# Patient Record
Sex: Female | Born: 1980 | Race: Black or African American | Hispanic: No | Marital: Married | Smoking: Current every day smoker
Health system: Southern US, Community
[De-identification: ages and names within clinical notes are randomized; demographics above are authoritative.]

## PROBLEM LIST (undated history)

## (undated) DIAGNOSIS — Z789 Other specified health status: Secondary | ICD-10-CM

## (undated) DIAGNOSIS — K921 Melena: Secondary | ICD-10-CM

## (undated) DIAGNOSIS — R569 Unspecified convulsions: Secondary | ICD-10-CM

## (undated) DIAGNOSIS — D649 Anemia, unspecified: Secondary | ICD-10-CM

## (undated) DIAGNOSIS — F1090 Alcohol use, unspecified, uncomplicated: Secondary | ICD-10-CM

## (undated) DIAGNOSIS — F329 Major depressive disorder, single episode, unspecified: Secondary | ICD-10-CM

## (undated) DIAGNOSIS — F319 Bipolar disorder, unspecified: Secondary | ICD-10-CM

## (undated) DIAGNOSIS — Z7289 Other problems related to lifestyle: Secondary | ICD-10-CM

## (undated) DIAGNOSIS — I10 Essential (primary) hypertension: Secondary | ICD-10-CM

## (undated) DIAGNOSIS — F32A Depression, unspecified: Secondary | ICD-10-CM

## (undated) HISTORY — PX: NO PAST SURGERIES: SHX2092

## (undated) HISTORY — DX: Melena: K92.1

---

## 2012-02-22 ENCOUNTER — Encounter (HOSPITAL_COMMUNITY): Payer: Self-pay | Admitting: *Deleted

## 2012-02-22 ENCOUNTER — Emergency Department (HOSPITAL_COMMUNITY)
Admission: EM | Admit: 2012-02-22 | Discharge: 2012-02-23 | Disposition: A | Discharge status: Home / Self Care | Payer: Self-pay | Attending: Psychiatry | Admitting: Psychiatry

## 2012-02-22 DIAGNOSIS — F1010 Alcohol abuse, uncomplicated: Secondary | ICD-10-CM | POA: Insufficient documentation

## 2012-02-22 DIAGNOSIS — F1910 Other psychoactive substance abuse, uncomplicated: Secondary | ICD-10-CM | POA: Insufficient documentation

## 2012-02-22 DIAGNOSIS — F319 Bipolar disorder, unspecified: Secondary | ICD-10-CM | POA: Insufficient documentation

## 2012-02-22 DIAGNOSIS — IMO0002 Reserved for concepts with insufficient information to code with codable children: Secondary | ICD-10-CM | POA: Insufficient documentation

## 2012-02-22 HISTORY — DX: Bipolar disorder, unspecified: F31.9

## 2012-02-22 HISTORY — DX: Unspecified convulsions: R56.9

## 2012-02-22 LAB — RAPID URINE DRUG SCREEN, HOSP PERFORMED
Amphetamines: NOT DETECTED
Barbiturates: NOT DETECTED
Benzodiazepines: NOT DETECTED
Tetrahydrocannabinol: NOT DETECTED

## 2012-02-22 LAB — CBC
HCT: 40 % (ref 36.0–46.0)
Hemoglobin: 13.8 g/dL (ref 12.0–15.0)
MCHC: 34.5 g/dL (ref 30.0–36.0)
RDW: 13.2 % (ref 11.5–15.5)
WBC: 8.4 10*3/uL (ref 4.0–10.5)

## 2012-02-22 LAB — COMPREHENSIVE METABOLIC PANEL
ALT: 19 U/L (ref 0–35)
AST: 28 U/L (ref 0–37)
Albumin: 4.2 g/dL (ref 3.5–5.2)
Alkaline Phosphatase: 110 U/L (ref 39–117)
BUN: 4 mg/dL — ABNORMAL LOW (ref 6–23)
Chloride: 95 mEq/L — ABNORMAL LOW (ref 96–112)
Potassium: 3.5 mEq/L (ref 3.5–5.1)
Sodium: 134 mEq/L — ABNORMAL LOW (ref 135–145)
Total Bilirubin: 0.1 mg/dL — ABNORMAL LOW (ref 0.3–1.2)
Total Protein: 8.3 g/dL (ref 6.0–8.3)

## 2012-02-22 LAB — POCT PREGNANCY, URINE: Preg Test, Ur: NEGATIVE

## 2012-02-22 MED ORDER — LORAZEPAM 1 MG PO TABS
1.0000 mg | ORAL_TABLET | Freq: Three times a day (TID) | ORAL | Status: DC | PRN
  Filled 2012-02-22: qty 1

## 2012-02-22 MED ORDER — ONDANSETRON HCL 4 MG PO TABS: 4.0000 mg | ORAL_TABLET | Freq: Three times a day (TID) | ORAL | Status: DC | PRN

## 2012-02-22 MED ORDER — ZIPRASIDONE MESYLATE 20 MG IM SOLR
20.0000 mg | Freq: Once | INTRAMUSCULAR | Status: AC
  Administered 2012-02-22: 20 mg via INTRAMUSCULAR
  Filled 2012-02-22: qty 20

## 2012-02-22 MED ORDER — ACETAMINOPHEN 325 MG PO TABS: 650.0000 mg | ORAL_TABLET | ORAL | Status: DC | PRN

## 2012-02-22 MED ORDER — ZOLPIDEM TARTRATE 5 MG PO TABS: 5.0000 mg | ORAL_TABLET | Freq: Every evening | ORAL | Status: DC | PRN

## 2012-02-22 MED ORDER — ALUM & MAG HYDROXIDE-SIMETH 200-200-20 MG/5ML PO SUSP: 30.0000 mL | ORAL | Status: DC | PRN

## 2012-02-22 MED ORDER — IBUPROFEN 600 MG PO TABS
600.0000 mg | ORAL_TABLET | Freq: Three times a day (TID) | ORAL | Status: DC | PRN
  Administered 2012-02-22: 600 mg via ORAL
  Filled 2012-02-22: qty 1

## 2012-02-22 MED ORDER — NICOTINE 21 MG/24HR TD PT24: 21.0000 mg | MEDICATED_PATCH | Freq: Every day | TRANSDERMAL | Status: DC

## 2012-02-22 NOTE — ED Notes (Signed)
Pt tried to leave the unit b/c another pt pacing the unit told her she could just go out the double doors. Pt said she didn't want to stay here any longer, she was safe and wanted to go home. Security contacted for assist. Pt finally went back to her room after procedures explained to her and some word search puzzles were copied for her as well. Pt came right back out of her room and tried getting out another set of double doors. Security and off duty officer intervened by talking to pt. Pt refused PO Ativan but wanted it to be given to "him, standing down there", however, the hallway was empty. Pt insisted there was someone there and wanted him to take the medicine. Pt was talking to this person sitting on the bed in her room as well. Called MD and obtained new orders. Pt given Geodon without incident. Pt's underwear remain on her body. Consulting civil engineer notified. Will try to get pt to take off after she wakes up.

## 2012-02-22 NOTE — BHH Counselor (Signed)
Pt became a flight risk upon arrival to the unit. IVC and required documentation completed, logged and placed in the pt chart.

## 2012-02-22 NOTE — ED Provider Notes (Signed)
9:16 AM Pt had been trying to get off the psych ward, Geodon injection ordered.  She has a history of bipolar, and had been treated with medicines by doctors in [place], [place].  She had recently moved to [place].  ACT team to see. Osvaldo Human, M.D.   Carleene Cooper III, MD 02/22/12 [phone]

## 2012-02-22 NOTE — BH Assessment (Signed)
Assessment Note   Samantha Howard is a 31 y.o. who presented voluntarily to [facility] via [employer]. However, upon arrival to [facility], she tried to escape the Psych ED and is now under IVC. Pt is poor historian. Pt endorses a euthymic or "alright" mood. Her only depressive symptom is lack of sleep which she attributes to watching too much TV late at night. Her affect is appropriate to circumstances and she is pleasant and cooperative. Pt denies substance abuse. However, she did report drinking more than a 12 pack on 02/21/12 but unsure of amount she drank after that. Writer noted that her drug test was positive for cocaine but pt denied use. 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 Pt notes that over 1 year ago, she experienced a 4-day period in which she didn't sleep at all and had an abundance of energy for projects around her neighborhood. Pt reports she was diagnosed with Bipolar D/O by a psychiatrist in [facility] approx 3 years ago. Pt states she quit taking her psychotropic meds over one year ago b/c she didn't think meds were helping her "feel better". Pt currently denies SI and HI. She also denies any history of suicide attempts. Pt states she called the [employer] this am because she was afraid of hurting herself or her partner. Pt reports she has no recollection of the suicidal statements she stated to [employer] upon their arrival. PT denies AVH and denies delusions. She reports that she was speaking to a pt who had just gone into his room, thus the reason that RN Archie Patten mistakenly thought pt was experiencing A/VH.  Collateral info obtained from [employer] via IVC papers - When [employer] arrived at pt's home, pt told [employer] to "shoot" her and that she "wanted to die".  Collateral info from RN Tonya - Pt was in her room this am speaking to someone who wasn't there in the hallway.  Axis I: 296.7 Bipolar D/O, Most Recent Episode Unspecified Axis II: Deferred Axis III:  Past Medical History  Diagnosis Date  .  Seizures   . Bipolar 1 disorder    Axis IV: occupational problems and problems with primary support group Axis V: 31-40 impairment in reality testing  Past Medical History:  Past Medical History  Diagnosis Date  . Seizures   . Bipolar 1 disorder     History reviewed. No pertinent past surgical history.  Family History: History reviewed. No pertinent family history.  Social History:  does not have a smoking history on file. She does not have any smokeless tobacco history on file. She reports that she drinks alcohol. She reports that she does not use illicit drugs.  Additional Social History:  Alcohol / Drug Use Pain Medications: none Prescriptions: none Over the Counter: none History of alcohol / drug use?: Yes Substance #1 Name of Substance 1: alcohol 1 - Age of First Use: 16 1 - Amount (size/oz): 48 oz 1 - Frequency: 3 times per week 1 - Duration: 14 years  1 - Last Use / Amount: 02/21/12 - 144 oz Substance #2 Name of Substance 2: marijuana 2 - Age of First Use: 16 2 - Amount (size/oz): 1 blunt 2 - Frequency: once a year 2 - Duration: once a year for 14 years 2 - Last Use / Amount: last Aug - Allergies:  Allergies  Allergen Reactions  . Latex Other (See Comments)    Reaction unknown due to it being so long ago    Home Medications:  Medications Prior to Admission  Medication Dose Route Frequency Provider Last Rate Last Dose  . acetaminophen (TYLENOL) tablet 650 mg  650 mg Oral Q4H PRN Ethelda Chick, MD      . alum & mag hydroxide-simeth (MAALOX/MYLANTA) 200-200-20 MG/5ML suspension 30 mL  30 mL Oral PRN Ethelda Chick, MD      . ibuprofen (ADVIL,MOTRIN) tablet 600 mg  600 mg Oral Q8H PRN Ethelda Chick, MD      . LORazepam (ATIVAN) tablet 1 mg  1 mg Oral Q8H PRN Ethelda Chick, MD      . nicotine (NICODERM CQ - dosed in mg/24 hours) patch 21 mg  21 mg Transdermal Daily Ethelda Chick, MD      . ondansetron [facility]) tablet 4 mg  4 mg Oral Q8H PRN Ethelda Chick, MD      . ziprasidone (GEODON) injection 20 mg  20 mg Intramuscular Once Carleene Cooper III, MD   20 mg at 02/22/12 0943  . zolpidem (AMBIEN) tablet 5 mg  5 mg Oral QHS PRN Ethelda Chick, MD       No current outpatient prescriptions on file as of 02/22/2012.    OB/GYN Status:  No LMP recorded.  General Assessment Data Location of Assessment: [facility] ED Living Arrangements: Spouse/significant other Can pt return to current living arrangement?: Yes Admission Status: Involuntary Is patient capable of signing voluntary admission?: Yes Transfer from: Acute Hospital Referral Source: Self/Family/Friend  Education Status Is patient currently in school?: No  Risk to self Suicidal Ideation: No Suicidal Intent: No Is patient at risk for suicide?: No Suicidal Plan?: No Access to Means: No What has been your use of drugs/alcohol within the last 12 months?: drinks alcohol 3 times a week Previous Attempts/Gestures: No How many times?: 2  Other Self Harm Risks: n/a Triggers for Past Attempts:  (n/a) Intentional Self Injurious Behavior: None Family Suicide History: No Recent stressful life event(s): Other (Comment) (searching for job) Persecutory voices/beliefs?: No Depression: No Depression Symptoms: Insomnia Substance abuse history and/or treatment for substance abuse?: No Suicide prevention information given to non-admitted patients: Not applicable  Risk to Others Homicidal Ideation: No Thoughts of Harm to Others: No Current Homicidal Intent: No Current Homicidal Plan: No Access to Homicidal Means: No Identified Victim: n/a History of harm to others?: No Assessment of Violence: None Noted Violent Behavior Description: n/a Does patient have access to weapons?: No Criminal Charges Pending?: No Does patient have a court date: No  Psychosis Hallucinations: None noted Delusions: None noted  Mental Status Report Appear/Hygiene:  (good hygiene) Eye Contact: Good Motor  Activity: Freedom of movement;Unremarkable Speech: Logical/coherent Level of Consciousness: Alert Mood:  (euthymic, "alright") Affect: Other (Comment) (euthymic) Anxiety Level: None Thought Processes: Coherent;Relevant Judgement: Impaired Orientation: Person;Place;Appropriate for developmental age;Situation;Time Obsessive Compulsive Thoughts/Behaviors: None  Cognitive Functioning Concentration: Normal Memory: Recent Intact;Remote Intact IQ: Average Insight: Fair Impulse Control: Fair Appetite: Good Weight Loss: 0  Weight Gain: 0  Sleep: No Change Total Hours of Sleep: 6  Vegetative Symptoms: None  Prior Inpatient Therapy Prior Inpatient Therapy: No Prior Therapy Dates: n/a Prior Therapy Facilty/Provider(s): n/a Reason for Treatment: n/a  Prior Outpatient Therapy Prior Outpatient Therapy: Yes Prior Therapy Dates: 2010 Prior Therapy Facilty/Provider(s): [place] Reason for Treatment: bipolar  ADL Screening (condition at time of admission) Patient's cognitive ability adequate to safely complete daily activities?: Yes Patient able to express need for assistance with ADLs?: Yes Independently performs ADLs?: Yes Weakness  of Legs: None Weakness of Arms/Hands: None       Abuse/Neglect Assessment (Assessment to be complete while patient is alone) Physical Abuse: Denies Verbal Abuse: Denies Sexual Abuse: Denies Exploitation of patient/patient's resources: Denies Self-Neglect: Denies Values / Beliefs Cultural Requests During Hospitalization: None Spiritual Requests During Hospitalization: None        Additional Information 1:1 In Past 12 Months?: No CIRT Risk: No Elopement Risk: Yes Does patient have medical clearance?: Yes     Disposition:  Disposition Disposition of Patient: Inpatient treatment program;Outpatient treatment Type of inpatient treatment program: Adult Type of outpatient treatment: Adult;Psych Intensive  Outpatient  On Site Evaluation by:   Reviewed with Physician:     Thornell Sartorius 02/22/2012 7:46 PM
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
## 2012-02-22 NOTE — ED Notes (Signed)
Pt aware of the need for a urine sample however, unable to void at this time. 

## 2012-02-23 NOTE — ED Notes (Signed)
Pt needs telepsych d/t being here more than 24 hours and no bed available at [facility]. MD Tucich to sign off once prepared.

## 2012-02-23 NOTE — ED Notes (Signed)
Patient discharged home via private vehicle with sister. Instructions reviewed. Pt verbalized understanding. Denies SI/HI. Denies A/V H.

## 2012-02-23 NOTE — ED Provider Notes (Addendum)
7:12 AM  Stable.  IVC.  SI.  Bipolar.  Per [facility] requested reassess in am to find out date of last seizure.   Patient is stable pending appropriate disposition.  No acute medical emergencies at this time.    Michelangelo Rindfleisch A. Patrica Duel, MD 02/23/12 1610   2:10 PM  Full Telepsych evaluation has been completed by the psychiatrist, Dr. Donnamae Jude. The psychiatrist, is recommending and did complete reversal of commitment. They are recommending alcohol and drug program and community health.  Patient will be discharged as per psychiatry recommendations. Again, IVC has been rescinded by the psychiatrist.  Lorelle Gibbs. Patrica Duel, MD 02/23/12 1411

## 2012-02-23 NOTE — ED Notes (Signed)
telepsych in progress 

## 2012-02-23 NOTE — ED Notes (Signed)
Pt given items to shower. 

## 2012-02-23 NOTE — ED Notes (Signed)
Pt d/c home, IVC papers rescinded per telepsych MD and our EDP. Pt notified, calling for a ride home.

## 2012-02-23 NOTE — Discharge Instructions (Signed)
Alcohol Problems Most adults who drink alcohol drink in moderation (not a lot) are at low risk for developing problems related to their drinking. However, all drinkers, including low-risk drinkers, should know about the health risks connected with drinking alcohol. RECOMMENDATIONS FOR LOW-RISK DRINKING  Drink in moderation. Moderate drinking is defined as follows:   Men - no more than 2 drinks per day.   Nonpregnant women - no more than 1 drink per day.   Over age 65 - no more than 1 drink per day.  A standard drink is 12 grams of pure alcohol, which is equal to a 12 ounce bottle of beer or wine cooler, a 5 ounce glass of wine, or 1.5 ounces of distilled spirits (such as whiskey, brandy, vodka, or rum).  ABSTAIN FROM (DO NOT DRINK) ALCOHOL:  When pregnant or considering pregnancy.   When taking a medication that interacts with alcohol.   If you are alcohol dependent.   A medical condition that prohibits drinking alcohol (such as ulcer, liver disease, or heart disease).  DISCUSS WITH YOUR CAREGIVER:  If you are at risk for coronary heart disease, discuss the potential benefits and risks of alcohol use: Light to moderate drinking is associated with lower rates of coronary heart disease in certain populations (for example, men over age 45 and postmenopausal women). Infrequent or nondrinkers are advised not to begin light to moderate drinking to reduce the risk of coronary heart disease so as to avoid creating an alcohol-related problem. Similar protective effects can likely be gained through proper diet and exercise.   Women and the elderly have smaller amounts of body water than men. As a result women and the elderly achieve a higher blood alcohol concentration after drinking the same amount of alcohol.   Exposing a fetus to alcohol can cause a broad range of birth defects referred to as Fetal Alcohol Syndrome (FAS) or Alcohol-Related Birth Defects (ARBD). Although FAS/ARBD is connected with  excessive alcohol consumption during pregnancy, studies also have reported neurobehavioral problems in infants born to mothers reporting drinking an average of 1 drink per day during pregnancy.   Heavier drinking (the consumption of more than 4 drinks per occasion by men and more than 3 drinks per occasion by women) impairs learning (cognitive) and psychomotor functions and increases the risk of alcohol-related problems, including accidents and injuries.  CAGE QUESTIONS:   Have you ever felt that you should Cut down on your drinking?   Have people Annoyed you by criticizing your drinking?   Have you ever felt bad or Guilty about your drinking?   Have you ever had a drink first thing in the morning to steady your nerves or get rid of a hangover (Eye opener)?  If you answered positively to any of these questions: You may be at risk for alcohol-related problems if alcohol consumption is:   Men: Greater than 14 drinks per week or more than 4 drinks per occasion.   Women: Greater than 7 drinks per week or more than 3 drinks per occasion.  Do you or your family have a medical history of alcohol-related problems, such as:  Blackouts.   Sexual dysfunction.   Depression.   Trauma.   Liver dysfunction.   Sleep disorders.   Hypertension.   Chronic abdominal pain.   Has your drinking ever caused you problems, such as problems with your family, problems with your work (or school) performance, or accidents/injuries?   Do you have a compulsion to drink or a preoccupation   with drinking?   Do you have poor control or are you unable to stop drinking once you have started?   Do you have to drink to avoid withdrawal symptoms?   Do you have problems with withdrawal such as tremors, nausea, sweats, or mood disturbances?   Does it take more alcohol than in the past to get you high?   Do you feel a strong urge to drink?   Do you change your plans so that you can have a drink?   Do you ever  drink in the morning to relieve the shakes or a hangover?  If you have answered a number of the previous questions positively, it may be time for you to talk to your caregivers, family, and friends and see if they think you have a problem. Alcoholism is a chemical dependency that keeps getting worse and will eventually destroy your health and relationships. Many alcoholics end up dead, impoverished, or in prison. This is often the end result of all chemical dependency.  Do not be discouraged if you are not ready to take action immediately.   Decisions to change behavior often involve up and down desires to change and feeling like you cannot decide.   Try to think more seriously about your drinking behavior.   Think of the reasons to quit.  WHERE TO GO FOR ADDITIONAL INFORMATION   The National Institute on Alcohol Abuse and Alcoholism (NIAAA)www.niaaa.nih.gov   National Council on Alcoholism and Drug Dependence (NCADD)www.ncadd.org   American Society of Addiction Medicine (ASAM)www.asam.org  Document Released: 12/16/2005 Document Revised: 08/28/2011 Document Reviewed: 08/03/2008 ExitCare Patient Information 2012 [employer].   RESOURCE GUIDE  Dental Problems  Patients with Medicaid: [facility]                     [facility] [address]                                           [address] Phone:  [phone]                                                  Phone:  [phone]  If unable to pay or uninsured, contact:  [facility] or [facility] to become qualified for the adult dental clinic.  Chronic Pain Problems Contact [facility]  297-2271 Patients need to be referred by their primary care doctor.  Insufficient Money for Medicine Contact [employer]:  call "211" or [facility] 271-5999.  No Primary Care Doctor Call [facility]  832-8000 Other agencies that provide inexpensive medical  care    [facility]  832-8035    [facility]  832-7272    [facility]  [phone]    Women's Clinic  832-4777    [facility]  373-0678    [facility]  272-1050  Psychological Services [facility]  832-9600 [facility]  [phone] [facility]   800 853-5163 (emergency services 641-4993)  Substance Abuse Resources [facility]  336-882-2125 [facility] 336-784-9470 [facility] 336-285-9073 [facility] 336-845-3988 Residential & Outpatient Substance Abuse Program  800-659-3381    Abuse/Neglect [place] Child Abuse Hotline (336) 641-3795 [place] Child Abuse Hotline 800-378-5315 (After Hours)  Emergency Shelter [facility] (336) 271-5985  M[facility] (336) 275-9566 [facility] (704) 372-4663  MRSA Hotline #:   832-7006    [place] Resources  [facility] [place]     [employer]                          [facility] [address] [place]                       [address]      [address]  [place]                                                [place]                            [place] Phone:  [phone]                                   Phone:  342-7768                 Phone:  [phone]  [facility] Phone:  342-8316  [place] Child Abuse Hotline (336) 342-1394 (336) 342-3537 (After Hours)    

## 2012-02-23 NOTE — ED Notes (Signed)
telepsych being done at this time.  

## 2012-02-23 NOTE — ED Notes (Signed)
telepsych completed 

## 2012-06-22 ENCOUNTER — Emergency Department (HOSPITAL_COMMUNITY)
Admission: EM | Admit: 2012-06-22 | Discharge: 2012-06-22 | Disposition: A | Discharge status: Home / Self Care | Payer: Self-pay | Attending: Emergency Medicine | Admitting: Emergency Medicine

## 2012-06-22 ENCOUNTER — Telehealth (HOSPITAL_COMMUNITY): Payer: Self-pay | Admitting: Licensed Clinical Social Worker

## 2012-06-22 ENCOUNTER — Encounter (HOSPITAL_COMMUNITY): Payer: Self-pay | Admitting: Emergency Medicine

## 2012-06-22 DIAGNOSIS — F1010 Alcohol abuse, uncomplicated: Secondary | ICD-10-CM | POA: Insufficient documentation

## 2012-06-22 DIAGNOSIS — F10929 Alcohol use, unspecified with intoxication, unspecified: Secondary | ICD-10-CM

## 2012-06-22 DIAGNOSIS — F319 Bipolar disorder, unspecified: Secondary | ICD-10-CM | POA: Insufficient documentation

## 2012-06-22 DIAGNOSIS — F17200 Nicotine dependence, unspecified, uncomplicated: Secondary | ICD-10-CM | POA: Insufficient documentation

## 2012-06-22 DIAGNOSIS — G40909 Epilepsy, unspecified, not intractable, without status epilepticus: Secondary | ICD-10-CM | POA: Insufficient documentation

## 2012-06-22 LAB — COMPREHENSIVE METABOLIC PANEL
Albumin: 3.9 g/dL (ref 3.5–5.2)
BUN: 6 mg/dL (ref 6–23)
Creatinine, Ser: 0.59 mg/dL (ref 0.50–1.10)
GFR calc Af Amer: >90 mL/min (ref >90)
Glucose, Bld: 103 mg/dL — ABNORMAL HIGH (ref 70–99)
Total Bilirubin: 0.1 mg/dL — ABNORMAL LOW (ref 0.3–1.2)
Total Protein: 7.7 g/dL (ref 6.0–8.3)

## 2012-06-22 LAB — CBC
HCT: 38.5 % (ref 36.0–46.0)
MCHC: 32.7 g/dL (ref 30.0–36.0)
MCV: 83.5 fL (ref 78.0–100.0)
RDW: 15.6 % — ABNORMAL HIGH (ref 11.5–15.5)

## 2012-06-22 LAB — ETHANOL
Alcohol, Ethyl (B): 303 mg/dL — ABNORMAL HIGH (ref 0–11)
Alcohol, Ethyl (B): 127 mg/dL — ABNORMAL HIGH (ref 0–11)

## 2012-06-22 LAB — RAPID URINE DRUG SCREEN, HOSP PERFORMED
Amphetamines: NOT DETECTED
Opiates: NOT DETECTED

## 2012-06-22 MED ORDER — NICOTINE 21 MG/24HR TD PT24
21.0000 mg | MEDICATED_PATCH | Freq: Every day | TRANSDERMAL | Status: DC
  Administered 2012-06-22: 21 mg via TRANSDERMAL
  Filled 2012-06-22: qty 1

## 2012-06-22 MED ORDER — ONDANSETRON HCL 4 MG PO TABS: 4.0000 mg | ORAL_TABLET | Freq: Three times a day (TID) | ORAL | Status: DC | PRN

## 2012-06-22 MED ORDER — ALUM & MAG HYDROXIDE-SIMETH 200-200-20 MG/5ML PO SUSP: 30.0000 mL | ORAL | Status: DC | PRN

## 2012-06-22 MED ORDER — ZOLPIDEM TARTRATE 5 MG PO TABS: 5.0000 mg | ORAL_TABLET | Freq: Every evening | ORAL | Status: DC | PRN

## 2012-06-22 MED ORDER — LORAZEPAM 1 MG PO TABS: 1.0000 mg | ORAL_TABLET | Freq: Three times a day (TID) | ORAL | Status: DC | PRN

## 2012-06-22 MED ORDER — POTASSIUM CHLORIDE CRYS ER 20 MEQ PO TBCR
40.0000 meq | EXTENDED_RELEASE_TABLET | Freq: Once | ORAL | Status: AC
  Administered 2012-06-22: 40 meq via ORAL
  Filled 2012-06-22: qty 2
  Filled 2012-06-22: qty 1

## 2012-06-22 MED ORDER — IBUPROFEN 200 MG PO TABS: 600.0000 mg | ORAL_TABLET | Freq: Three times a day (TID) | ORAL | Status: DC | PRN

## 2012-06-22 NOTE — BH Assessment (Signed)
Assessment Note   Samantha Howard is an 31 y.o. female. Patient presented to the Emergency Department initially intoxicated with claims of SI/HI. At the time of assessment, patient was no longer intoxicated and displayed rational and logical thinking. Patient reports she was angry with her girlfriend and began feeling angry towards her, which escalated the more she drank alcohol.  Patient reports plan to return to [facility] and seeing her psychiatrist, Dr. Kennith Center to get started on medications.  Spoke with EDP regarding patient's current state who agreed with disposition of patient to be discharged and follow up with outpatient psychiatrist.    Axis I: Substance Induced Mood Disorder Axis II: Deferred Axis III:  Past Medical History  Diagnosis Date  . Seizures   . Bipolar 1 disorder    Axis IV: other psychosocial or environmental problems Axis V: 51-60 moderate symptoms  Past Medical History:  Past Medical History  Diagnosis Date  . Seizures   . Bipolar 1 disorder     History reviewed. No pertinent past surgical history.  Family History: No family history on file.  Social History:  reports that she has been smoking Cigarettes.  She has been smoking about 1 pack per day. She does not have any smokeless tobacco history on file. She reports that she drinks alcohol. She reports that she uses illicit drugs (Marijuana).  Additional Social History:     CIWA: CIWA-Ar BP: 104/65 mmHg Pulse Rate: 108  COWS:    Allergies:  Allergies  Allergen Reactions  . Latex Other (See Comments)    Reaction unknown due to it being so long ago    Home Medications:  (Not in a hospital admission)  OB/GYN Status:  Patient's last menstrual period was 06/22/2012.  General Assessment Data Location of Assessment: [facility] Living Arrangements: Spouse/significant other Can pt return to current living arrangement?: Yes Admission Status: Voluntary Is patient capable of signing voluntary  admission?: Yes Transfer from: Acute Hospital Referral Source: Self/Family/Friend     Risk to self Suicidal Ideation: No Suicidal Intent: No Is patient at risk for suicide?: No Suicidal Plan?: No Access to Means: No What has been your use of drugs/alcohol within the last 12 months?: Alcohol, drinking, 1 bottle of "burnettes", "not often" Previous Attempts/Gestures: No Intentional Self Injurious Behavior: None Family Suicide History: No Recent stressful life event(s): Conflict (Comment) (Conflict with girlfriend) Persecutory voices/beliefs?: No Depression: Yes Substance abuse history and/or treatment for substance abuse?: Yes Suicide prevention information given to non-admitted patients: Not applicable  Risk to Others Homicidal Ideation: No Thoughts of Harm to Others: No Current Homicidal Intent: No Current Homicidal Plan: No Access to Homicidal Means: No History of harm to others?: No Assessment of Violence: None Noted Does patient have access to weapons?: No Criminal Charges Pending?: No Does patient have a court date: No  Psychosis Hallucinations: None noted Delusions: None noted  Mental Status Report Appear/Hygiene: Improved Eye Contact: Good Motor Activity: Restlessness;Freedom of movement;Gestures Speech: Logical/coherent Level of Consciousness: Alert Mood: Anxious Affect: Appropriate to circumstance Anxiety Level: None Thought Processes: Relevant;Coherent Judgement: Unimpaired Orientation: Person;Place;Time;Situation Obsessive Compulsive Thoughts/Behaviors: None  Cognitive Functioning Concentration: Decreased Memory: Recent Intact;Remote Intact IQ: Average Insight: Fair Impulse Control: Fair Appetite: Good Sleep: No Change Vegetative Symptoms: None  ADLScreening [facility] Assessment Services) Patient's cognitive ability adequate to safely complete daily activities?: Yes Patient able to express need for assistance with ADLs?: Yes Independently performs  ADLs?: Yes  Abuse/Neglect [facility]) Physical Abuse: Denies Verbal Abuse: Denies Sexual Abuse: Denies  Prior  Inpatient Therapy Prior Inpatient Therapy: Yes Prior Therapy Dates: Unable to recall date Prior Therapy Facilty/Provider(s): Hospital in [facility] Reason for Treatment: Med management  Prior Outpatient Therapy Prior Outpatient Therapy: Yes Prior Therapy Dates: Current Prior Therapy Facilty/Provider(s): Dr. Kennith Center Reason for Treatment: Bipolar Disorder  ADL Screening (condition at time of admission) Patient's cognitive ability adequate to safely complete daily activities?: Yes Patient able to express need for assistance with ADLs?: Yes Independently performs ADLs?: Yes       Abuse/Neglect Assessment (Assessment to be complete while patient is alone) Physical Abuse: Denies Verbal Abuse: Denies Sexual Abuse: Denies Values / Beliefs Cultural Requests During Hospitalization: None Spiritual Requests During Hospitalization: None        Additional Information 1:1 In Past 12 Months?: No CIRT Risk: No Elopement Risk: No Does patient have medical clearance?: Yes     Disposition:  Disposition Disposition of Patient: Outpatient treatment Type of outpatient treatment: Adult (With psychiatrist - Dr. Kennith Center)  On Site Evaluation by:   Reviewed with Physician:     Marlaine Hind ANN S 06/22/2012 2:27 PMe

## 2012-06-22 NOTE — Discharge Instructions (Signed)
Do not drink alcohol in excess, and never drink and drive. See resource guide provided. Follow up with primary care doctor in coming week. Return to ER if worse, new symptoms, other concern.        Alcohol Intoxication You have alcohol intoxication when the amount of alcohol that you have consumed has impaired your ability to mentally and physically function. There are a variety of factors that contribute to the level at which alcohol intoxication can occur, such as age, gender, weight, frequency of alcohol consumption, medication use, and the presence of other medical conditions, such as diabetes, seizures, or heart conditions. The blood alcohol level test measures the concentration of alcohol in your blood. In most states, your blood alcohol level must be lower than 80 mg/dL (1.61%) to legally drive. However, many dangerous effects of alcohol can occur at much lower levels. Alcohol directly impairs the normal chemical activity of the brain and is said to be a chemical depressant. Alcohol can cause drowsiness, stupor, respiratory failure, and coma. Other physical effects can include headache, vomiting, vomiting of blood, abdominal pain, a fast heartbeat, difficulty breathing, anxiety, and amnesia. Alcohol intoxication can also lead to dangerous and life-threatening activities, such as fighting, dangerous operation of vehicles or heavy machinery, and risky sexual behavior. Alcohol can be especially dangerous when taken with other drugs. Some of these drugs are:  Sedatives.   Painkillers.   Marijuana.   Tranquilizers.   Antihistamines.   Muscle relaxants.   Seizure medicine.  Many of the effects of acute alcohol intoxication are temporary. However, repeated alcohol intoxication can lead to severe medical illnesses. If you have alcohol intoxication, you should:  Stay hydrated. Drink enough water and fluids to keep your urine clear or pale yellow. Avoid excessive caffeine because this can  further lead to dehydration.   Eat a healthy diet. You may have residual nausea, headache, and loss of appetite, but it is still important that you maintain good nutrition. You can start with clear liquids.   Take nonsteroidal anti-inflammatory medications as needed for headaches, but make sure to do so with small meals. You should avoid acetaminophen for several days after having alcohol intoxication because the combination of alcohol and acetaminophen can be toxic to your liver.  If you have frequent alcohol intoxication, ask your friends and family if they think you have a drinking problem. For further help, contact:  Your caregiver.   Alcoholics Anonymous (AA).   A drug or alcohol rehabilitation program.  SEEK MEDICAL CARE IF:   You have persistent vomiting.   You have persistent pain in any part of your body.   You do not feel better after a few days.  SEEK IMMEDIATE MEDICAL CARE IF:   You become shaky or tremble when you try to stop drinking.   You shake uncontrollably (seizure).   You throw up (vomit) blood. This may be bright red or it may look like black coffee grounds.   You have blood in the stool. This may be bright red or appear as a black, tarry, bad smelling stool.   You become lightheaded or faint.  ANY OF THESE SYMPTOMS MAY REPRESENT A SERIOUS PROBLEM THAT IS AN EMERGENCY. Do not wait to see if the symptoms will go away. Get medical help right away. Call your local emergency services (911 in [place] DO NOT drive yourself to the hospital. MAKE SURE YOU:   Understand these instructions.   Will watch your condition.   Will get help right away  if you are not doing well or get worse.  Document Released: 09/25/2005 Document Revised: 12/05/2011 Document Reviewed: 06/04/2010 [employer] Patient Information 2012 [place], [place].      RESOURCE GUIDE  Chronic Pain Problems: Contact [facility]  [phone] Patients need to be referred by their  primary care doctor.  Insufficient Money for Medicine: Contact United Way:  call "211" or [facility] ([phone])331-4354.  No Primary Care Doctor: - Call Health Connect  [phone] - can help you locate a primary care doctor that  accepts your insurance, provides certain services, etc. - Physician Referral Service- [phone]  Agencies that provide inexpensive medical care: - [facility]  [phone] - [facility]  [phone] - [facility]  [phone] - Women's Clinic  [phone] - Planned Parenthood  [phone] [facility]  [phone]  Medicaid-accepting [facility] Providers: - [facility]- [address]  [phone], Mon-Fri 9am-7pm, Sat 9am-1pm - [facility]- [address] [place], [address] [place]  [phone] - [facility]- [address] [place]  [phone] [facility]- [address]  [phone] - Renaye Rakers- [address] [place], [address], [phone]  Only accepts [place] patients after they have their name  applied to their card  Self Pay (no insurance) in [place]: - Sickle Cell Patients: Dr Willey Blade, [facility] Internal Medicine  [address] [place], [phone] - [facility]- [address] [place]  [phone]       [facility] [place]- [address]       -     [facility]- see information above (Speak to [employer] if you do not have insurance)       -  [facility]- [address] [place], [phone]       -  [facility]- [address],  [phone]       -  [facility]- [address] [phone]       -  Dr Julio Sicks-  [address], [place], [phone]       -  [facility]- [address] [phone]       -  [facility] [place]- [address] [phone], also [address], [phone]        -    [facility]- [address] [place], [phone], 1st & 3rd Saturday   every month, 10am-1pm  1) Find a Doctor and Pay Out of Pocket Although you won't have to find out who is covered by your insurance plan, it is a good idea to ask around and get recommendations. You will then need to call the office and see if the doctor you have chosen will accept you as a new patient and what types of options they offer for patients who are self-pay. Some doctors offer discounts or will set up payment plans for their patients who do not have insurance, but you will need to ask so you aren't surprised when you get to your appointment.  2) Contact Your Local Health Department Not all health departments have doctors that can see patients for sick visits, but many do, so it is worth a call to see if yours does. If you don't know where your  local health department is, you can check in your phone book. The CDC also has a tool to help you locate your state's health department, and many state websites also have listings of all of their local health departments.  3) Find a Walk-in Clinic If your illness is not likely to be very severe or complicated, you may want to try a walk in clinic. These are popping up all over the country in pharmacies, drugstores, and shopping centers. They're usually staffed by nurse practitioners or physician assistants that have been trained to treat common illnesses and complaints. They're usually fairly quick and inexpensive. However, if you have serious medical issues or chronic medical problems, these are probably not your best option  STD Testing - [facility] Department of [facility] [place], STD Clinic, [address] [place], phone [phone] or [phone].  Monday - Friday, call for an appointment. [facility] Department of [employer], STD Clinic, [place] [address], [place], phone [phone] or [phone].  Monday - Friday, call for an  appointment.  Abuse/Neglect: [facility] Child Abuse Hotline 443-676-0816 [facility] Child Abuse Hotline 509-626-8944 (After Hours)  Emergency Shelter:  [facility] ([phone])329-5961  Maternity Homes: - Room at the [place] [facility] [phone] - [facility] ([phone]  MRSA Hotline #:   770-325-0494  [facility] Resources  [facility] [place]  United Way [facility] Dept. [address]                 [address]         [address] [place] [address]  Blondell Reveal Phone:  [phone]                                  Phone:  [phone]                   Phone:  [phone]  [facility], [phone] - [facility] - [phone]       -     [facility] in [place], [address]                                  [phone], [facility] Child Abuse Hotline 239-306-6806 or (540) 241-4673 (After Hours)   Behavioral Health Services  Substance Abuse Resources: - Alcohol and Drug Services  [phone] - [facility] [phone] - [facility] [place] [phone] Floydene Flock [phone] - Residential & Outpatient Substance Abuse Program  ([phone]  Psychological Services: [facility]  ([phone] Services  [phone] - [facility], [phone] [place]. [address] [place], ACCESS LINE: [phone] or ([phone], [employer]za  Dental Assistance  If unable to pay or uninsured, contact:  [facility] or [facility].  to become qualified for the adult dental clinic.  Patients with Medicaid: [facility] [phone] [address], [phone] [address] [phone]  If unable to pay,  or uninsured, contact [facility] 423-863-7655) or [facility] Department [phone] in [place], [phone] in [facility]) to become qualified for the adult dental clinic  Other Low-Cost Community Dental Services: - [facility]- [address] [place], [place], [place], [address], [phone], [phone], 2nd and 4th Thursday of the month at 6:30am.  10 clients each day by appointment, can sometimes see walk-in patients if someone does not show for an appointment. [facility]- [address] Ether Griffins [place], [place], [address], [phone] - [facility]- [address] [place], [place], [address], [phone] - [facility]- [phone] [facility]- [phone] [facility]- [phone]

## 2012-06-22 NOTE — ED Notes (Signed)
Pt provided paper scrubs with instructions 

## 2012-06-22 NOTE — ED Notes (Signed)
Pt alert, nad, [place]stre[place]", brought per [employer], + ETOH, thoughts of self harm, denies plan, resp even unlabored, skin pwd

## 2012-06-22 NOTE — ED Provider Notes (Addendum)
Pt resting comfortably. Nad. Vitals stable.  Will recheck this am.   Suzi Roots, MD 06/22/12 [phone]   Act has reassessed pt. Pt earlier was very intoxicated. Pt currently sober, alert, cooperative. Act team indicates pt w etoh abuse last pm, no chronic etoh abuse or signs etoh withdrawal. No hallucinations or delusions. No acute psychosis. No depression or SI. States pt and family feel ready for d/c home.   Suzi Roots, MD 06/22/12 [phone]

## 2012-06-22 NOTE — ED Notes (Signed)
After pt got back from shower.  She sat in her chair and began rocking back and forth quickly.  Moved chair away from wall so that pt would not accidentally hit head.

## 2012-06-22 NOTE — ED Notes (Signed)
Lab bedside.

## 2012-06-22 NOTE — ED Provider Notes (Signed)
History     CSN: [id number]  Arrival date & time 06/22/12  0255   First MD Initiated Contact with Patient 06/22/12 [phone]      Chief Complaint  Patient presents with  . Medical Clearance   HPI  History provided by the patient. Patient is a 31 year old female with history of bipolar disorder, polysubstance abuse and seizure disorder who was brought to emergency room by [employer] after reporting that she had thoughts of SI. Patient states to me that the "truth is" she was upset with her girlfriend and they have been arguments the past 2 nights. Patient states she argued again tonight due to the fact that her girlfriend wanted to go out to clubs. Patient states that she was actually very angry and upset about this and wanted to wait at home for become home today and "beat her up". Instead patient states that she has been drinking and decided to call 911. Patient states that she had been treated in the past for her bipolar and mood disorders but has been off medications for almost one year. Patient was on Haldol in the past. Patient denies any other complaints. She denies any SI to me. She denies any hallucinations.     Past Medical History  Diagnosis Date  . Seizures   . Bipolar 1 disorder     History reviewed. No pertinent past surgical history.  No family history on file.  History  Substance Use Topics  . Smoking status: Current Everyday Smoker -- 1.0 packs/day    Types: Cigarettes  . Smokeless tobacco: Not on file  . Alcohol Use: Yes    OB History    Grav Para Term Preterm Abortions TAB SAB Ect Mult Living                  Review of Systems  Constitutional: Negative for fever and chills.  Respiratory: Negative for cough.   Gastrointestinal: Negative for nausea and vomiting.  Psychiatric/Behavioral: Positive for behavioral problems and agitation. Negative for suicidal ideas, hallucinations, confusion and self-injury. The patient is not hyperactive.     Allergies   Latex  Home Medications   Current Outpatient Rx  Name Route Sig Dispense Refill  . HALOPERIDOL 5 MG PO TABS Oral Take 10 mg by mouth at bedtime.      BP 132/76  Pulse 105  Temp 98 F (36.7 C)  Resp 16  Wt 169 lb (76.658 kg)  SpO2 99%  LMP 06/22/2012  Physical Exam  Nursing note and vitals reviewed. Constitutional: She is oriented to person, place, and time. She appears well-developed and well-nourished. No distress.  HENT:  Head: Normocephalic.  Mouth/Throat: Oropharynx is clear and moist.  Cardiovascular: Normal rate and regular rhythm.   Pulmonary/Chest: Effort normal and breath sounds normal.  Neurological: She is alert and oriented to person, place, and time.  Skin: Skin is warm and dry.  Psychiatric: She has a normal mood and affect. Her behavior is normal.    ED Course  Procedures   Results for orders placed during the hospital encounter of 06/22/12  CBC      Component Value Range   WBC 7.5  4.0 - 10.5 K/uL   RBC 4.61  3.87 - 5.11 MIL/uL   Hemoglobin 12.6  12.0 - 15.0 g/dL   HCT 11.9  14.7 - 82.9 %   MCV 83.5  78.0 - 100.0 fL   MCH 27.3  26.0 - 34.0 pg   MCHC 32.7  30.0 - 36.0  g/dL   RDW 16.1 (*) 09.6 - 04.5 %   Platelets 386  150 - 400 K/uL  COMPREHENSIVE METABOLIC PANEL      Component Value Range   Sodium 141  135 - 145 mEq/L   Potassium 3.1 (*) 3.5 - 5.1 mEq/L   Chloride 104  96 - 112 mEq/L   CO2 24  19 - 32 mEq/L   Glucose, Bld 103 (*) 70 - 99 mg/dL   BUN 6  6 - 23 mg/dL   Creatinine, Ser 4.09  0.50 - 1.10 mg/dL   Calcium 9.0  8.4 - 81.1 mg/dL   Total Protein 7.7  6.0 - 8.3 g/dL   Albumin 3.9  3.5 - 5.2 g/dL   AST 20  0 - 37 U/L   ALT 12  0 - 35 U/L   Alkaline Phosphatase 95  39 - 117 U/L   Total Bilirubin 0.1 (*) 0.3 - 1.2 mg/dL   GFR calc non Af Amer >90  >90 mL/min   GFR calc Af Amer >90  >90 mL/min  ETHANOL      Component Value Range   Alcohol, Ethyl (B) 303 (*) 0 - 11 mg/dL  URINE RAPID DRUG SCREEN (HOSP PERFORMED)      Component  Value Range   Opiates NONE DETECTED  NONE DETECTED   Cocaine NONE DETECTED  NONE DETECTED   Benzodiazepines NONE DETECTED  NONE DETECTED   Amphetamines NONE DETECTED  NONE DETECTED   Tetrahydrocannabinol NONE DETECTED  NONE DETECTED   Barbiturates NONE DETECTED  NONE DETECTED  PREGNANCY, URINE      Component Value Range   Preg Test, Ur NEGATIVE  NEGATIVE       1. Alcohol intoxication       MDM  Patient seen and evaluated. Patient no acute distress.   Plan to obtain tele psych eval. Patient moved to psych holding with holding orders in place.     Angus Seller, [place] 06/22/12 [phone]

## 2012-06-22 NOTE — ED Provider Notes (Signed)
Medical screening examination/treatment/procedure(s) were performed by non-physician practitioner and as supervising physician I was immediately available for consultation/collaboration.   Gerhard Munch, MD 06/22/12 [phone]

## 2012-06-22 NOTE — ED Notes (Signed)
Pt attempt to remove lip piercing w/o success, Psych ED notiifed

## 2013-03-27 ENCOUNTER — Inpatient Hospital Stay (HOSPITAL_COMMUNITY)
Admission: AD | Admit: 2013-03-27 | Discharge: 2013-03-27 | Disposition: A | Discharge status: Home / Self Care | Payer: Self-pay | Source: Ambulatory Visit | Attending: Obstetrics & Gynecology | Admitting: Obstetrics & Gynecology

## 2013-03-27 ENCOUNTER — Encounter (HOSPITAL_COMMUNITY): Payer: Self-pay | Admitting: *Deleted

## 2013-03-27 DIAGNOSIS — R21 Rash and other nonspecific skin eruption: Secondary | ICD-10-CM | POA: Insufficient documentation

## 2013-03-27 DIAGNOSIS — L509 Urticaria, unspecified: Secondary | ICD-10-CM | POA: Insufficient documentation

## 2013-03-27 MED ORDER — METHYLPREDNISOLONE 4 MG PO KIT: PACK | ORAL | Status: DC

## 2013-03-27 MED ORDER — DIPHENHYDRAMINE HCL 25 MG PO CAPS
50.0000 mg | ORAL_CAPSULE | Freq: Once | ORAL | Status: AC
  Administered 2013-03-27: 50 mg via ORAL
  Filled 2013-03-27: qty 2

## 2013-03-27 MED ORDER — DIPHENHYDRAMINE HCL 50 MG PO CAPS: 50.0000 mg | ORAL_CAPSULE | Freq: Once | ORAL | Status: DC

## 2013-03-27 NOTE — MAU Provider Note (Signed)
  History     CSN: [id number]  Arrival date and time: 03/27/13 0230   First Provider Initiated Contact with Patient 03/27/13 0257      No chief complaint on file.  HPI  Samantha Howard is a 32 y.o. who presents today because she has "ithcy skin." She states that about a month ago she started to get a rash, and as the day have progresses it has gotten worse.   Past Medical History  Diagnosis Date  . Bipolar 1 disorder   . Seizures     Denies    Past Surgical History  Procedure Laterality Date  . No past surgeries      History reviewed. No pertinent family history.  History  Substance Use Topics  . Smoking status: Current Every Day Smoker -- 1.00 packs/day    Types: Cigarettes  . Smokeless tobacco: Not on file  . Alcohol Use: 1.8 oz/week    3 Cans of beer per week    Allergies:  Allergies  Allergen Reactions  . Latex Other (See Comments)    Reaction unknown due to it being so long ago    Prescriptions prior to admission  Medication Sig Dispense Refill  . haloperidol (HALDOL) 5 MG tablet Take 10 mg by mouth at bedtime.        Review of Systems  Constitutional: Negative for fever and chills.  Eyes: Negative for blurred vision.  Respiratory: Negative for cough and wheezing.   Cardiovascular: Negative for chest pain and orthopnea.  Gastrointestinal: Negative for nausea, vomiting, abdominal pain, diarrhea and constipation.  Genitourinary: Negative for dysuria, urgency and frequency.  Musculoskeletal: Negative for myalgias.  Neurological: Negative for dizziness and headaches.   Physical Exam   Height 5\' 6"  (1.676 m), weight 79.833 kg (176 lb), last menstrual period 03/23/2013.  Physical Exam  Nursing note and vitals reviewed. Constitutional: She is oriented to person, place, and time. She appears well-developed and well-nourished. No distress.  Cardiovascular: Normal rate.   Respiratory: Effort normal.  GI: Soft. She exhibits no distension.   Neurological: She is alert and oriented to person, place, and time.  Skin: Skin is warm and dry. Rash (Hives to legs, abdomen, back) noted.    MAU Course  Procedures    Assessment and Plan   1. Hives    RX benadryl Medrol dose pack Return to ED as needed or if sx worse  Tawnya Crook 03/27/2013, 3:35 AM

## 2013-03-27 NOTE — MAU Note (Signed)
Noticed an itchy/burn rash on her inner thigh and has spread all over her body. Noticed 3-4 weeks ago the one spot on leg. In the past week has noticed more areas. Does have eczema. Did start using a new soap 2 weeks ago.

## 2013-04-07 ENCOUNTER — Inpatient Hospital Stay (HOSPITAL_COMMUNITY): Payer: Self-pay

## 2013-04-07 ENCOUNTER — Encounter (HOSPITAL_COMMUNITY): Payer: Self-pay

## 2013-04-07 ENCOUNTER — Inpatient Hospital Stay (HOSPITAL_COMMUNITY)
Admission: AD | Admit: 2013-04-07 | Discharge: 2013-04-07 | Disposition: A | Discharge status: Home / Self Care | Payer: Self-pay | Source: Ambulatory Visit | Attending: Emergency Medicine | Admitting: Emergency Medicine

## 2013-04-07 DIAGNOSIS — L509 Urticaria, unspecified: Secondary | ICD-10-CM | POA: Insufficient documentation

## 2013-04-07 DIAGNOSIS — F319 Bipolar disorder, unspecified: Secondary | ICD-10-CM | POA: Insufficient documentation

## 2013-04-07 DIAGNOSIS — R21 Rash and other nonspecific skin eruption: Secondary | ICD-10-CM | POA: Insufficient documentation

## 2013-04-07 DIAGNOSIS — Z79899 Other long term (current) drug therapy: Secondary | ICD-10-CM | POA: Insufficient documentation

## 2013-04-07 DIAGNOSIS — R6883 Chills (without fever): Secondary | ICD-10-CM | POA: Insufficient documentation

## 2013-04-07 DIAGNOSIS — F17200 Nicotine dependence, unspecified, uncomplicated: Secondary | ICD-10-CM | POA: Insufficient documentation

## 2013-04-07 DIAGNOSIS — R062 Wheezing: Secondary | ICD-10-CM | POA: Insufficient documentation

## 2013-04-07 DIAGNOSIS — R061 Stridor: Secondary | ICD-10-CM | POA: Insufficient documentation

## 2013-04-07 DIAGNOSIS — T7840XA Allergy, unspecified, initial encounter: Secondary | ICD-10-CM

## 2013-04-07 DIAGNOSIS — R61 Generalized hyperhidrosis: Secondary | ICD-10-CM | POA: Insufficient documentation

## 2013-04-07 DIAGNOSIS — J029 Acute pharyngitis, unspecified: Secondary | ICD-10-CM | POA: Insufficient documentation

## 2013-04-07 MED ORDER — ALBUTEROL SULFATE (5 MG/ML) 0.5% IN NEBU
2.5000 mg | INHALATION_SOLUTION | Freq: Once | RESPIRATORY_TRACT | Status: AC
Start: 2013-04-07 — End: 2013-04-07
  Administered 2013-04-07: 2.5 mg via RESPIRATORY_TRACT

## 2013-04-07 MED ORDER — DIPHENHYDRAMINE HCL 50 MG/ML IJ SOLN
50.0000 mg | Freq: Once | INTRAMUSCULAR | Status: AC
  Administered 2013-04-07: 50 mg via INTRAVENOUS
  Filled 2013-04-07: qty 1

## 2013-04-07 MED ORDER — IOHEXOL 300 MG/ML  SOLN
75.0000 mL | Freq: Once | INTRAMUSCULAR | Status: AC | PRN
  Administered 2013-04-07: 75 mL via INTRAVENOUS

## 2013-04-07 MED ORDER — SODIUM CHLORIDE 0.9 % IJ SOLN
INTRAMUSCULAR | Status: AC
  Filled 2013-04-07: qty 3

## 2013-04-07 MED ORDER — METHYLPREDNISOLONE SODIUM SUCC 125 MG IJ SOLR
125.0000 mg | Freq: Once | INTRAMUSCULAR | Status: AC
  Administered 2013-04-07: 125 mg via INTRAVENOUS
  Filled 2013-04-07: qty 2

## 2013-04-07 MED ORDER — EPINEPHRINE HCL 1 MG/ML IJ SOLN
0.3000 mg | Freq: Once | INTRAMUSCULAR | Status: AC
  Administered 2013-04-07: 0.3 mg via SUBCUTANEOUS

## 2013-04-07 MED ORDER — RACEPINEPHRINE HCL 2.25 % IN NEBU
0.5000 mL | INHALATION_SOLUTION | Freq: Once | RESPIRATORY_TRACT | Status: AC
  Administered 2013-04-07: 0.5 mL via RESPIRATORY_TRACT
  Filled 2013-04-07: qty 0.5

## 2013-04-07 MED ORDER — EPINEPHRINE 0.3 MG/0.3ML IJ DEVI: 0.3000 mg | Freq: Once | INTRAMUSCULAR | Status: DC

## 2013-04-07 MED ORDER — SODIUM CHLORIDE 0.9 % IV BOLUS (SEPSIS)
1000.0000 mL | Freq: Once | INTRAVENOUS | Status: AC
  Administered 2013-04-07: 1000 mL via INTRAVENOUS

## 2013-04-07 MED ORDER — PREDNISONE 20 MG PO TABS
60.0000 mg | ORAL_TABLET | Freq: Once | ORAL | Status: AC
  Administered 2013-04-07: 60 mg via ORAL
  Filled 2013-04-07: qty 3

## 2013-04-07 MED ORDER — PREDNISONE 10 MG PO TABS: 60.0000 mg | ORAL_TABLET | Freq: Every day | ORAL | Status: DC

## 2013-04-07 MED ORDER — DIPHENHYDRAMINE HCL 25 MG PO CAPS: 25.0000 mg | ORAL_CAPSULE | Freq: Four times a day (QID) | ORAL | Status: AC

## 2013-04-07 MED ORDER — RACEPINEPHRINE HCL 2.25 % IN NEBU
0.2500 mL | INHALATION_SOLUTION | Freq: Once | RESPIRATORY_TRACT | Status: DC
  Filled 2013-04-07: qty 0.5

## 2013-04-07 MED ORDER — FAMOTIDINE IN NACL 20-0.9 MG/50ML-% IV SOLN
20.0000 mg | Freq: Once | INTRAVENOUS | Status: AC
  Administered 2013-04-07: 20 mg via INTRAVENOUS
  Filled 2013-04-07: qty 50

## 2013-04-07 NOTE — ED Provider Notes (Signed)
I saw and evaluated the patient, reviewed the resident's note and I agree with the findings and plan.  Pt has obvious pharyngitis.  No sign of abscess. Question whether some of her rash was related to viral exanthema and not allergic reaction.  Will dc home steroids.    Celene Kras, MD 04/07/13 [phone]

## 2013-04-07 NOTE — ED Notes (Signed)
Per report from [facility] pt arrived to [facility] with a CC of SOB and allergic reaction.  Pt states that she woke up feeling as though she was choking.  Resp symmetrical and and unlabored at the present.  States that she is feeling better at the present.  Pt is noted to be hoarse.

## 2013-04-07 NOTE — ED Notes (Signed)
The pt returned from c-t 

## 2013-04-07 NOTE — ED Notes (Signed)
The pt reports that she feels very good now as compared to earlier.  She has a sl sorethroat.  .  She has had an allergic reaction previously and did not know what caused it.

## 2013-04-07 NOTE — ED Notes (Signed)
The pt is not wearing her 02.  No diff breathing.  Although her sats remain low

## 2013-04-07 NOTE — ED Provider Notes (Signed)
History     CSN: [id number]  Arrival date & time 04/07/13  1657   First MD Initiated Contact with Patient 04/07/13 1807      Chief Complaint  Patient presents with  . Shortness of Breath    (Consider location/radiation/quality/duration/timing/severity/associated sxs/prior treatment) Patient is a 32 y.o. female presenting with allergic reaction and pharyngitis.  Allergic Reaction The primary symptoms are  shortness of breath, rash and urticaria. Episode onset: a few hours PTA. The problem has been rapidly improving. This is a recurrent (similar episode last week which was not as severe as today) problem.  Location: legs, abdomen.  Sore Throat This is a new problem. The current episode started in the past 7 days. The problem occurs constantly. The problem has been gradually worsening. Associated symptoms include chills, diaphoresis and a rash. Pertinent negatives include no fever. Nothing aggravates the symptoms. She has tried nothing for the symptoms.    Past Medical History  Diagnosis Date  . Bipolar 1 disorder   . Seizures     Denies    Past Surgical History  Procedure Laterality Date  . No past surgeries      No family history on file.  History  Substance Use Topics  . Smoking status: Current Every Day Smoker -- 1.00 packs/day    Types: Cigarettes  . Smokeless tobacco: Not on file  . Alcohol Use: 1.8 oz/week    3 Cans of beer per week    OB History   Grav Para Term Preterm Abortions TAB SAB Ect Mult Living   0               Review of Systems  Constitutional: Positive for chills and diaphoresis. Negative for fever.  Respiratory: Positive for shortness of breath.   Skin: Positive for rash.  All other systems reviewed and are negative.    Allergies  Latex  Home Medications   Current Outpatient Rx  Name  Route  Sig  Dispense  Refill  . diphenhydrAMINE (BENADRYL) 50 MG capsule   Oral   Take 1 capsule (50 mg total) by mouth once.   30 capsule   0    . haloperidol (HALDOL) 5 MG tablet   Oral   Take 10 mg by mouth at bedtime.         . methylPREDNISolone (MEDROL, PAK,) 4 MG tablet      follow package directions   21 tablet   0     BP 146/105  Pulse 109  Resp 18  SpO2 100%  LMP 03/23/2013  Physical Exam  Nursing note and vitals reviewed. Constitutional: She is oriented to person, place, and time. She appears well-developed and well-nourished. No distress.  HENT:  Head: Normocephalic and atraumatic.  Mouth/Throat: Oropharynx is clear and moist.  Eyes: Conjunctivae are normal. Pupils are equal, round, and reactive to light. No scleral icterus.  Neck: Neck supple.  Cardiovascular: Normal rate, regular rhythm, normal heart sounds and intact distal pulses.   No murmur heard. Pulmonary/Chest: Effort normal and breath sounds normal. No stridor. No respiratory distress. She has no rales.  Abdominal: Soft. Bowel sounds are normal. She exhibits no distension. There is no tenderness.  Musculoskeletal: Normal range of motion.  Neurological: She is alert and oriented to person, place, and time.  Skin: Skin is warm and dry. Rash noted. Rash is macular (dark colored macular rash on flank, mostly on left abdomen, but not conforming strictly to a dermatomal pattern. ) and urticarial (BLE).  Psychiatric:  She has a normal mood and affect. Her behavior is normal.    ED Course  Procedures (including critical care time)  Labs Reviewed  RAPID STREP SCREEN   Ct Soft Tissue Neck W Contrast  04/07/2013  *RADIOLOGY REPORT*  Clinical Data: Shortness of breath.  Difficulty breathing, hives and throat swelling 3 days ago.  CT NECK WITH CONTRAST  Technique:  Multidetector CT imaging of the neck was performed with intravenous contrast.  Contrast: 75mL OMNIPAQUE IOHEXOL 300 MG/ML  SOLN  Comparison: None.  Findings: The palatine tonsils are somewhat heterogeneous and mildly prominent.  The adenoid tissue is somewhat prominent as well.  There is mild  narrowing of the nasal pharyngeal airway.  No discrete abscess is present.  No focal mucosal or submucosal lesions are evident.  A multinodular goiter is present without a dominant to the lesion. The vocal cords are midline and symmetric.  The subglottic trachea is unremarkable.  Bilateral enlarged level II lymph nodes are likely reactive.  The bone windows are unremarkable.  The lung apices are clear.  IMPRESSION:  1.  Enlarged palatine tonsils and adenoid tissue with narrowing of the nasopharyngeal airway. 2.  Heterogeneity of the palatine tonsils compatible with acute inflammation.  No discrete abscess or phlegmon is evident. 3.  Mild to cervical adenopathy is likely reactive. 4.  Multinodular thyroid goiter without to a dominant lesion.   Original Report Authenticated By: Marin Roberts, M.D.   All radiology studies independently viewed by me.      1. Allergic reaction, initial encounter       MDM   32 yo female with an apparent allergic reaction who was evaluated today at [facility].  She states she woke up from sleeping and felt as if she was choking.  She also complained of an itchy rash on her legs.  She was given IV solumedrol, Benadryl, racemic epi, IM epi, and albuterol prior to arrival and stated she felt much better after her medications.    She states she had a similar episode about a week ago, during which time she was also evaluated at [facility].  She states that she had a similar rash at that time, which has now developed into a dark, macular rash.    Her history is complicated by the fact that she has never had an allergic reaction before and knows of no exposures that might have triggered these episodes.  She does however have a sore throat as well as chills and sweats.  She has enlarged tonsils, that are elongated and covered in exudate.  They appear grossly symmetrical.  However, given her unusual presentation, plan to scan her neck looking for abscess.     10:54 PM CT was negative for abscess or phlegmon.  She remained stable, well appearing, and stated she felt better prior to discharge.  Lungs clear and no stridor prior to discharge.  DC'd with burst of prednisone and benadryl.         Rennis Petty, MD 04/07/13 2256

## 2013-04-07 NOTE — Progress Notes (Signed)
RT at bedside, breathing tx in progress. Patient states she is starting to breathe easier. [facility] here.

## 2013-04-07 NOTE — Progress Notes (Signed)
Patient transferred to [facility] via [facility] in guarded condition.

## 2013-04-07 NOTE — ED Notes (Signed)
Pt to xray for a soft tissue neck

## 2013-04-07 NOTE — MAU Provider Note (Cosign Needed)
  History     CSN: [id number]  Arrival date and time: 04/07/13 1657   None     Chief Complaint  Patient presents with  . Shortness of Breath   HPI [name] is a 32 y.o. G0 female who presents to MAU today with complaint of throat swelling and difficulty breathing. The patient was brought back emergently and only a short history was obtained. The patient states that she has had a similar episode about 1 week ago. She has hives that have ascended from her lower extremities. She denies history of allergies and is unsure of what has caused this reaction today or last week. She denies new medications or foods. She lives in [facility] and will be returning home next week.   OB History   Grav Para Term Preterm Abortions TAB SAB Ect Mult Living   0               Past Medical History  Diagnosis Date  . Bipolar 1 disorder   . Seizures     Denies    Past Surgical History  Procedure Laterality Date  . No past surgeries      No family history on file.  History  Substance Use Topics  . Smoking status: Current Every Day Smoker -- 1.00 packs/day    Types: Cigarettes  . Smokeless tobacco: Not on file  . Alcohol Use: 1.8 oz/week    3 Cans of beer per week    Allergies:  Allergies  Allergen Reactions  . Latex Other (See Comments)    Reaction unknown due to it being so long ago    Prescriptions prior to admission  Medication Sig Dispense Refill  . diphenhydrAMINE (BENADRYL) 50 MG capsule Take 1 capsule (50 mg total) by mouth once.  30 capsule  0  . haloperidol (HALDOL) 5 MG tablet Take 10 mg by mouth at bedtime.      . methylPREDNISolone (MEDROL, PAK,) 4 MG tablet follow package directions  21 tablet  0    Review of Systems  Constitutional: Negative for fever.  HENT: Positive for sore throat.   Respiratory: Positive for shortness of breath, wheezing and stridor.   Skin: Positive for rash.   Physical Exam   Blood pressure 146/105, pulse 109, resp. rate  18, last menstrual period 03/23/2013, SpO2 100.00%.  Physical Exam  Constitutional: She is oriented to person, place, and time. She appears well-developed and well-nourished. She appears distressed.  HENT:  Head: Normocephalic and atraumatic.  Cardiovascular: Tachycardia present.   Respiratory: She is in respiratory distress. She has wheezes.  Neurological: She is alert and oriented to person, place, and time.  Skin: Skin is warm and dry. No erythema.  Psychiatric: She has a normal mood and affect.   MAU Course  Procedures None  MDM Dr. Ninetta Lights called to MAU to evaluate. Verbal orders given and followed. Patient received 1 L NS IV, Benadryl 50 mg IV, solumedrol 125 mg IV and Racemic epinephrine nebulizer. Some improvement in symptoms. Patient stabilized for transfer. Dr. Bebe Shaggy is the accepting physician Orders placed for subQ epinephrine 0.3 mg and albuterol nebulizer for [facility] transfer Assessment and Plan  A: Allergic reaction, anaphylaxis  P: Patient stabilized and transferred to [facility] for further evaluation and management of symptoms Patient advised to follow-up with PCP or specialist for allergy testing ASAP  Samantha Starr, PA-C  04/07/2013, 5:35 PM

## 2013-04-07 NOTE — Progress Notes (Signed)
Dr. Hatchett at bedside.  

## 2013-04-07 NOTE — ED Notes (Signed)
Pt undressed, in gown, on monitor, continuous pulse oximetry and blood pressure cuff 

## 2013-04-07 NOTE — Progress Notes (Signed)
Patient states she had onset of difficulty breathing, hives 3 days ago. States this has happened before, but does not know cause. States has steadily become worse.Denies any new meds., foods or known allergies Rapid response team @ bedside.

## 2013-04-07 NOTE — Progress Notes (Signed)
Late entry note- called via rapid response team call to MAU room 8 at 1710  for pt with difficulty breathing , stridor, diminished breath sounds and pt complaint of "throat swelling".  Pt was being assessed by O.R. Staff and team members.  Orders rec'd to give racemic epi 0.5cc via [facility]N. Pt tolerated tx well.  [facility] team at bedside to transfer pt.

## 2013-04-07 NOTE — ED Notes (Signed)
The pt is comfortable 

## 2014-02-07 ENCOUNTER — Emergency Department (HOSPITAL_COMMUNITY)
Admission: EM | Admit: 2014-02-07 | Discharge: 2014-02-07 | Disposition: A | Discharge status: Home / Self Care | Payer: Self-pay | Attending: Emergency Medicine | Admitting: Emergency Medicine

## 2014-02-07 ENCOUNTER — Encounter (HOSPITAL_COMMUNITY): Payer: Self-pay | Admitting: Emergency Medicine

## 2014-02-07 DIAGNOSIS — Z8669 Personal history of other diseases of the nervous system and sense organs: Secondary | ICD-10-CM | POA: Insufficient documentation

## 2014-02-07 DIAGNOSIS — IMO0002 Reserved for concepts with insufficient information to code with codable children: Secondary | ICD-10-CM | POA: Insufficient documentation

## 2014-02-07 DIAGNOSIS — F319 Bipolar disorder, unspecified: Secondary | ICD-10-CM | POA: Insufficient documentation

## 2014-02-07 DIAGNOSIS — F32A Depression, unspecified: Secondary | ICD-10-CM

## 2014-02-07 DIAGNOSIS — Z3202 Encounter for pregnancy test, result negative: Secondary | ICD-10-CM | POA: Insufficient documentation

## 2014-02-07 DIAGNOSIS — F911 Conduct disorder, childhood-onset type: Secondary | ICD-10-CM | POA: Insufficient documentation

## 2014-02-07 DIAGNOSIS — F17200 Nicotine dependence, unspecified, uncomplicated: Secondary | ICD-10-CM | POA: Insufficient documentation

## 2014-02-07 DIAGNOSIS — F329 Major depressive disorder, single episode, unspecified: Secondary | ICD-10-CM

## 2014-02-07 DIAGNOSIS — Z91040 Latex allergy status: Secondary | ICD-10-CM | POA: Insufficient documentation

## 2014-02-07 HISTORY — DX: Depression, unspecified: F32.A

## 2014-02-07 HISTORY — DX: Major depressive disorder, single episode, unspecified: F32.9

## 2014-02-07 LAB — COMPREHENSIVE METABOLIC PANEL
ALBUMIN: 3.8 g/dL (ref 3.5–5.2)
ALK PHOS: 85 U/L (ref 39–117)
ALT: 9 U/L (ref 0–35)
AST: 13 U/L (ref 0–37)
BUN: 7 mg/dL (ref 6–23)
CHLORIDE: 104 meq/L (ref 96–112)
CO2: 21 mEq/L (ref 19–32)
Calcium: 8.4 mg/dL (ref 8.4–10.5)
Creatinine, Ser: 0.57 mg/dL (ref 0.50–1.10)
GFR calc Af Amer: >90 mL/min (ref >90)
GFR calc non Af Amer: >90 mL/min (ref >90)
Glucose, Bld: 101 mg/dL — ABNORMAL HIGH (ref 70–99)
POTASSIUM: 3.7 meq/L (ref 3.7–5.3)
SODIUM: 140 meq/L (ref 137–147)
TOTAL PROTEIN: 7.9 g/dL (ref 6.0–8.3)
Total Bilirubin: <0.2 mg/dL — ABNORMAL LOW (ref 0.3–1.2)

## 2014-02-07 LAB — CBC
HCT: 34.3 % — ABNORMAL LOW (ref 36.0–46.0)
Hemoglobin: 11.5 g/dL — ABNORMAL LOW (ref 12.0–15.0)
MCH: 25.8 pg — ABNORMAL LOW (ref 26.0–34.0)
MCHC: 33.5 g/dL (ref 30.0–36.0)
MCV: 76.9 fL — ABNORMAL LOW (ref 78.0–100.0)
PLATELETS: 482 10*3/uL — AB (ref 150–400)
RBC: 4.46 MIL/uL (ref 3.87–5.11)
RDW: 15.5 % (ref 11.5–15.5)
WBC: 7.3 10*3/uL (ref 4.0–10.5)

## 2014-02-07 LAB — RAPID URINE DRUG SCREEN, HOSP PERFORMED
AMPHETAMINES: NOT DETECTED
BARBITURATES: NOT DETECTED
BENZODIAZEPINES: NOT DETECTED
COCAINE: NOT DETECTED
Opiates: NOT DETECTED
Tetrahydrocannabinol: NOT DETECTED

## 2014-02-07 LAB — SALICYLATE LEVEL: Salicylate Lvl: <2 mg/dL — ABNORMAL LOW (ref 2.8–20.0)

## 2014-02-07 LAB — ACETAMINOPHEN LEVEL

## 2014-02-07 LAB — ETHANOL: ALCOHOL ETHYL (B): 309 mg/dL — AB (ref 0–11)

## 2014-02-07 LAB — POCT PREGNANCY, URINE: Preg Test, Ur: NEGATIVE
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
## 2014-02-07 NOTE — ED Notes (Signed)
Patient has 2 belonging bags in triage. Pants, shorts, shirt, sneakers, cell phone

## 2014-02-07 NOTE — Discharge Instructions (Signed)
Please use the resource guide given to you by our behavior health counselor.    Depression, Adult Depression refers to feeling sad, low, down in the dumps, blue, gloomy, or empty. In general, there are two kinds of depression: 1. Depression that we all experience from time to time because of upsetting life experiences, including the loss of a job or the ending of a relationship (normal sadness or normal grief). This kind of depression is considered normal, is short lived, and resolves within a few days to 2 weeks. (Depression experienced after the loss of a loved one is called bereavement. Bereavement often lasts longer than 2 weeks but normally gets better with time.) 2. Clinical depression, which lasts longer than normal sadness or normal grief or interferes with your ability to function at home, at work, and in school. It also interferes with your personal relationships. It affects almost every aspect of your life. Clinical depression is an illness. Symptoms of depression also can be caused by conditions other than normal sadness and grief or clinical depression. Examples of these conditions are listed as follows:  Physical illness Some physical illnesses, including underactive thyroid gland (hypothyroidism), severe anemia, specific types of cancer, diabetes, uncontrolled seizures, heart and lung problems, strokes, and chronic pain are commonly associated with symptoms of depression.  Side effects of some prescription medicine In some people, certain types of prescription medicine can cause symptoms of depression.  Substance abuse Abuse of alcohol and illicit drugs can cause symptoms of depression. SYMPTOMS Symptoms of normal sadness and normal grief include the following:  Feeling sad or crying for short periods of time.  Not caring about anything (apathy).  Difficulty sleeping or sleeping too much.  No longer able to enjoy the things you used to enjoy.  Desire to be by oneself all the  time (social isolation).  Lack of energy or motivation.  Difficulty concentrating or remembering.  Change in appetite or weight.  Restlessness or agitation. Symptoms of clinical depression include the same symptoms of normal sadness or normal grief and also the following symptoms:  Feeling sad or crying all the time.  Feelings of guilt or worthlessness.  Feelings of hopelessness or helplessness.  Thoughts of suicide or the desire to harm yourself (suicidal ideation).  Loss of touch with reality (psychotic symptoms). Seeing or hearing things that are not real (hallucinations) or having false beliefs about your life or the people around you (delusions and paranoia). DIAGNOSIS  The diagnosis of clinical depression usually is based on the severity and duration of the symptoms. Your caregiver also will ask you questions about your medical history and substance use to find out if physical illness, use of prescription medicine, or substance abuse is causing your depression. Your caregiver also may order blood tests. TREATMENT  Typically, normal sadness and normal grief do not require treatment. However, sometimes antidepressant medicine is prescribed for bereavement to ease the depressive symptoms until they resolve. The treatment for clinical depression depends on the severity of your symptoms but typically includes antidepressant medicine, counseling with a mental health professional, or a combination of both. Your caregiver will help to determine what treatment is best for you. Depression caused by physical illness usually goes away with appropriate medical treatment of the illness. If prescription medicine is causing depression, talk with your caregiver about stopping the medicine, decreasing the dose, or substituting another medicine. Depression caused by abuse of alcohol or illicit drugs abuse goes away with abstinence from these substances. Some adults need professional  help in order to  stop drinking or using drugs. SEEK IMMEDIATE CARE IF:  You have thoughts about hurting yourself or others.  You lose touch with reality (have psychotic symptoms).  You are taking medicine for depression and have a serious side effect. FOR MORE INFORMATION National [employer] on Mental Illness: www.nami.[employer]: https://carter.com/ Document Released: 12/13/2000 Document Revised: 06/16/2012 Document Reviewed: 03/16/2012 [facility] Patient Information 2014 [place].

## 2014-02-08 LAB — URINE CULTURE
CULTURE: NO GROWTH
Colony Count: NO GROWTH

## 2014-12-30 HISTORY — PX: ABDOMINAL HYSTERECTOMY: SHX81

## 2015-02-26 ENCOUNTER — Emergency Department (HOSPITAL_COMMUNITY)
Admission: EM | Admit: 2015-02-26 | Discharge: 2015-02-27 | Disposition: A | Discharge status: Home / Self Care | Payer: Self-pay | Attending: Emergency Medicine | Admitting: Emergency Medicine

## 2015-02-26 ENCOUNTER — Encounter (HOSPITAL_COMMUNITY): Payer: Self-pay | Admitting: *Deleted

## 2015-02-26 ENCOUNTER — Emergency Department (HOSPITAL_COMMUNITY): Payer: Self-pay

## 2015-02-26 DIAGNOSIS — R079 Chest pain, unspecified: Secondary | ICD-10-CM

## 2015-02-26 DIAGNOSIS — E041 Nontoxic single thyroid nodule: Secondary | ICD-10-CM

## 2015-02-26 DIAGNOSIS — R0682 Tachypnea, not elsewhere classified: Secondary | ICD-10-CM | POA: Insufficient documentation

## 2015-02-26 DIAGNOSIS — K625 Hemorrhage of anus and rectum: Secondary | ICD-10-CM

## 2015-02-26 DIAGNOSIS — Z8659 Personal history of other mental and behavioral disorders: Secondary | ICD-10-CM | POA: Insufficient documentation

## 2015-02-26 DIAGNOSIS — D509 Iron deficiency anemia, unspecified: Secondary | ICD-10-CM

## 2015-02-26 DIAGNOSIS — N920 Excessive and frequent menstruation with regular cycle: Secondary | ICD-10-CM

## 2015-02-26 DIAGNOSIS — Z720 Tobacco use: Secondary | ICD-10-CM | POA: Insufficient documentation

## 2015-02-26 DIAGNOSIS — R000 Tachycardia, unspecified: Secondary | ICD-10-CM

## 2015-02-26 DIAGNOSIS — R064 Hyperventilation: Secondary | ICD-10-CM | POA: Insufficient documentation

## 2015-02-26 DIAGNOSIS — Z91040 Latex allergy status: Secondary | ICD-10-CM | POA: Insufficient documentation

## 2015-02-26 LAB — I-STAT CHEM 8, ED
BUN: 8 mg/dL (ref 6–23)
CALCIUM ION: 1.01 mmol/L — AB (ref 1.12–1.23)
Chloride: 106 mmol/L (ref 96–112)
Creatinine, Ser: 1.1 mg/dL (ref 0.50–1.10)
Glucose, Bld: 106 mg/dL — ABNORMAL HIGH (ref 70–99)
HCT: 32 % — ABNORMAL LOW (ref 36.0–46.0)
Hemoglobin: 10.9 g/dL — ABNORMAL LOW (ref 12.0–15.0)
Potassium: 4.1 mmol/L (ref 3.5–5.1)
SODIUM: 143 mmol/L (ref 135–145)
TCO2: 18 mmol/L (ref 0–100)

## 2015-02-26 LAB — CBC
HCT: 28.4 % — ABNORMAL LOW (ref 36.0–46.0)
HEMOGLOBIN: 8.5 g/dL — AB (ref 12.0–15.0)
MCH: 20.9 pg — ABNORMAL LOW (ref 26.0–34.0)
MCHC: 29.9 g/dL — ABNORMAL LOW (ref 30.0–36.0)
MCV: 70 fL — ABNORMAL LOW (ref 78.0–100.0)
Platelets: 401 10*3/uL — ABNORMAL HIGH (ref 150–400)
RBC: 4.06 MIL/uL (ref 3.87–5.11)
RDW: 17.9 % — ABNORMAL HIGH (ref 11.5–15.5)
WBC: 10.6 10*3/uL — ABNORMAL HIGH (ref 4.0–10.5)

## 2015-02-26 LAB — I-STAT TROPONIN, ED: TROPONIN I, POC: 0 ng/mL (ref 0.00–0.08)

## 2015-02-26 MED ORDER — LORAZEPAM 2 MG/ML IJ SOLN
2.0000 mg | Freq: Once | INTRAMUSCULAR | Status: AC
  Administered 2015-02-26: 2 mg via INTRAVENOUS
  Filled 2015-02-26: qty 1

## 2015-02-26 NOTE — ED Notes (Signed)
Pt. Took 4 caffeine pills and 1 5hr energy and a beer and was c/o her heart racing and couldn't catch her breath. EMS called. Bystander reports seizure like activity, but none witnessed by EMS. Pt. Is A&Ox4 and agitated

## 2015-02-27 ENCOUNTER — Emergency Department (HOSPITAL_COMMUNITY): Payer: Self-pay

## 2015-02-27 ENCOUNTER — Encounter (HOSPITAL_COMMUNITY): Payer: Self-pay | Admitting: Radiology

## 2015-02-27 MED ORDER — IOHEXOL 350 MG/ML SOLN
100.0000 mL | Freq: Once | INTRAVENOUS | Status: AC | PRN
Start: 2015-02-27 — End: 2015-02-27
  Administered 2015-02-27: 100 mL via INTRAVENOUS

## 2015-02-27 MED ORDER — FERROUS SULFATE 325 (65 FE) MG PO TABS: 325.0000 mg | ORAL_TABLET | Freq: Two times a day (BID) | ORAL | Status: DC

## 2015-02-27 NOTE — ED Provider Notes (Signed)
Patient initially seen and evaluated by Dr. Sabra Heck for episode of tachycardia. Sent for CT angiogram to rule out pulmonary embolism. CT angiogram is negative for pulmonary embolism, but incidental finding of thyroid nodules is noted. She also has had heavy vaginal bleeding and rectal bleeding and significant iron deficiency anemia. She is given referrals to gynecology and gastroenterology for follow-up. Encouraged to get outpatient thyroid ultrasound to evaluate her thyroid nodules. Given a note to be off work for bradycardia 29th because of how late she was staying in the emergency department. Her partner was also given a note to be off work.  Results for orders placed or performed during the hospital encounter of 02/26/15  CBC  Result Value Ref Range   WBC 10.6 (H) 4.0 - 10.5 K/uL   RBC 4.06 3.87 - 5.11 MIL/uL   Hemoglobin 8.5 (L) 12.0 - 15.0 g/dL   HCT 28.4 (L) 36.0 - 46.0 %   MCV 70.0 (L) 78.0 - 100.0 fL   MCH 20.9 (L) 26.0 - 34.0 pg   MCHC 29.9 (L) 30.0 - 36.0 g/dL   RDW 17.9 (H) 11.5 - 15.5 %   Platelets 401 (H) 150 - 400 K/uL  I-stat chem 8, ed  Result Value Ref Range   Sodium 143 135 - 145 mmol/L   Potassium 4.1 3.5 - 5.1 mmol/L   Chloride 106 96 - 112 mmol/L   BUN 8 6 - 23 mg/dL   Creatinine, Ser 1.10 0.50 - 1.10 mg/dL   Glucose, Bld 106 (H) 70 - 99 mg/dL   Calcium, Ion 1.01 (L) 1.12 - 1.23 mmol/L   TCO2 18 0 - 100 mmol/L   Hemoglobin 10.9 (L) 12.0 - 15.0 g/dL   HCT 32.0 (L) 36.0 - 46.0 %  I-stat troponin, ED  Result Value Ref Range   Troponin i, poc 0.00 0.00 - 0.08 ng/mL   Comment 3           Ct Angio Chest Pe W/cm &/or Wo Cm  02/27/2015   CLINICAL DATA:  Tachycardia; could not catch breath. Seizure-like activity. Took four caffeine pills, an energy drink and a beer. Initial encounter.  EXAM: CT ANGIOGRAPHY CHEST WITH CONTRAST  TECHNIQUE: Multidetector CT imaging of the chest was performed using the standard protocol during bolus administration of intravenous contrast.  Multiplanar CT image reconstructions and MIPs were obtained to evaluate the vascular anatomy.  CONTRAST:  143mL OMNIPAQUE IOHEXOL 350 MG/ML SOLN  COMPARISON:  Chest radiograph performed 02/26/2015  FINDINGS: There is no evidence of significant pulmonary embolus. Evaluation for pulmonary embolus is suboptimal due to limitations in the timing of the contrast bolus.  Minimal bilateral atelectasis is noted. The lungs are otherwise clear. There is no evidence of significant focal consolidation, pleural effusion or pneumothorax. No masses are identified; no abnormal focal contrast enhancement is seen.  The mediastinum is unremarkable in appearance. No mediastinal lymphadenopathy is seen. No pericardial effusion is identified. The great vessels are grossly unremarkable in appearance. No axillary lymphadenopathy is seen. Multiple hypodense and heterogeneous nodules are seen in the thyroid gland, measuring up to 1.7 cm in size.  The visualized portions of the liver and spleen are unremarkable. The visualized portions of the pancreas, gallbladder, stomach, adrenal glands and kidneys are within normal limits.  No acute osseous abnormalities are seen.  Review of the MIP images confirms the above findings.  IMPRESSION: 1. No evidence of significant pulmonary embolus. 2. Minimal bilateral atelectasis noted; lungs otherwise clear. 3. Multiple hypodense and heterogeneous nodules  in the thyroid gland, measuring up to 1.7 cm in size. Consider further evaluation with thyroid ultrasound. If patient is clinically hyperthyroid, consider nuclear medicine thyroid uptake and scan.   Electronically Signed   By: Garald Balding M.D.   On: 02/27/2015 02:06   Dg Chest Port 1 View  02/26/2015   CLINICAL DATA:  Chest pain.  Seizure today with headache.  EXAM: PORTABLE CHEST - 1 VIEW  COMPARISON:  None.  FINDINGS: Normal heart size and mediastinal contours. No acute infiltrate or edema. No effusion or pneumothorax. Remote right AC joint  dislocation with heterotopic opacification between the coracoid and clavicle. No acute osseous findings.  IMPRESSION: No active disease.   Electronically Signed   By: Monte Fantasia M.D.   On: 02/26/2015 39:76      Delora Fuel, MD 73/41/93 [id number]

## 2015-02-27 NOTE — Discharge Instructions (Signed)
Your CT scan showed several nodules in your thyroid gland. This should be further evaluated with an ultrasound of your thyroid gland.  Your anemia is probably related to blood loss from your heavy periods. Please follow-up with the gynecologist for further evaluation treatment of that. Also, bleeding in your stools is never normal. Please make an appointment with a gastroenterologist to further evaluate that.  Iron Deficiency Anemia Anemia is a condition in which there are less red blood cells or hemoglobin in the blood than normal. Hemoglobin is the part of red blood cells that carries oxygen. Iron deficiency anemia is anemia caused by too little iron. It is the most common type of anemia. It may leave you tired and short of breath. CAUSES   Lack of iron in the diet.  Poor absorption of iron, as seen with intestinal disorders.  Intestinal bleeding.  Heavy periods. SIGNS AND SYMPTOMS  Mild anemia may not be noticeable. Symptoms may include:  Fatigue.  Headache.  Pale skin.  Weakness.  Tiredness.  Shortness of breath.  Dizziness.  Cold hands and feet.  Fast or irregular heartbeat. DIAGNOSIS  Diagnosis requires a thorough evaluation and physical exam by your health care provider. Blood tests are generally used to confirm iron deficiency anemia. Additional tests may be done to find the underlying cause of your anemia. These may include:  Testing for blood in the stool (fecal occult blood test).  A procedure to see inside the colon and rectum (colonoscopy).  A procedure to see inside the esophagus and stomach (endoscopy). TREATMENT  Iron deficiency anemia is treated by correcting the cause of the deficiency. Treatment may involve:  Adding iron-rich foods to your diet.  Taking iron supplements. Pregnant or breastfeeding women need to take extra iron because their normal diet usually does not provide the required amount.  Taking vitamins. Vitamin C improves the absorption  of iron. Your health care provider may recommend that you take your iron tablets with a glass of orange juice or vitamin C supplement.  Medicines to make heavy menstrual flow lighter.  Surgery. HOME CARE INSTRUCTIONS   Take iron as directed by your health care provider.  If you cannot tolerate taking iron supplements by mouth, talk to your health care provider about taking them through a vein (intravenously) or an injection into a muscle.  For the best iron absorption, iron supplements should be taken on an empty stomach. If you cannot tolerate them on an empty stomach, you may need to take them with food.  Do not drink milk or take antacids at the same time as your iron supplements. Milk and antacids may interfere with the absorption of iron.  Iron supplements can cause constipation. Make sure to include fiber in your diet to prevent constipation. A stool softener may also be recommended.  Take vitamins as directed by your health care provider.  Eat a diet rich in iron. Foods high in iron include liver, lean beef, whole-grain bread, eggs, dried fruit, and dark green leafy vegetables. SEEK IMMEDIATE MEDICAL CARE IF:   You faint. If this happens, do not drive. Call your local emergency services (911 in U.S.) if no other help is available.  You have chest pain.  You feel nauseous or vomit.  You have severe or increased shortness of breath with activity.  You feel weak.  You have a rapid heartbeat.  You have unexplained sweating.  You become light-headed when getting up from a chair or bed. MAKE SURE YOU:   Understand  these instructions.  Will watch your condition.  Will get help right away if you are not doing well or get worse. Document Released: 12/13/2000 Document Revised: 12/21/2013 Document Reviewed: 08/23/2013 [facility] Patient Information 2015 [place], [place]. This information is not intended to replace advice given to you by your health care provider. Make sure you  discuss any questions you have with your health care provider.  Iron tablets, capsules, extended-release tablets What is this medicine? IRON (AHY ern) replaces iron that is essential to healthy red blood cells. Iron is used to treat iron deficiency anemia. Anemia may cause problems like tiredness, shortness of breath, or slowed growth in children. Only take iron if your doctor has told you to. Do not treat yourself with iron if you are feeling tired. Most healthy people get enough iron in their diets, particularly if they eat cereals, meat, poultry, and fish. This medicine may be used for other purposes; ask your health care provider or pharmacist if you have questions. COMMON BRAND NAME(S): Bifera, Duofer, Feosol, Feosol Complete, [employer], [place], [place], [place], Fero-Grad 500, Ferretts, Ferrimin, Ferro-Sequels, Hemocyte, Nephro-Fer, NovaFerrum, ProFe, Slow Fe, Slow Iron, Tandem What should I tell my health care provider before I take this medicine? They need to know if you have any of these conditions: -frequently drink alcohol -bowel disease -hemolytic anemia -iron overload (hemochromatosis, hemosiderosis) -liver disease -problems with swallowing -stomach ulcer or other stomach problems -an unusual or allergic reaction to iron, other medicines, foods, dyes, or preservatives -pregnant or trying to get pregnant -breast-feeding How should I use this medicine? Take this medicine by mouth with a glass of water or fruit juice. Follow the directions on the prescription label. Swallow whole. Do not crush or chew. Take this medicine in an upright or sitting position. Try to take any bedtime doses at least 10 minutes before lying down. You may take this medicine with food. Take your medicine at regular intervals. Do not take your medicine more often than directed. Do not stop taking except on your doctor's advice. Talk to your pediatrician regarding the use of this medicine in children.  While this drug may be prescribed for selected conditions, precautions do apply. Overdosage: If you think you have taken too much of this medicine contact a poison control center or emergency room at once. NOTE: This medicine is only for you. Do not share this medicine with others. What if I miss a dose? If you miss a dose, take it as soon as you can. If it is almost time for your next dose, take only that dose. Do not take double or extra doses. What may interact with this medicine? If you are taking this iron product, you should not take iron in any other medicine or dietary supplement. This medicine may also interact with the following medications: -alendronate -antacids -cefdinir -chloramphenicol -cholestyramine -deferoxamine -dimercaprol -etidronate -medicines for stomach ulcers or other stomach problems -pancreatic enzymes -quinolone antibiotics (examples: Cipro, Floxin, Levaquin, Tequin and others) -risedronate -tetracycline antibiotics (examples: doxycycline, tetracycline, minocycline, and others) -thyroid hormones This list may not describe all possible interactions. Give your health care provider a list of all the medicines, herbs, non-prescription drugs, or dietary supplements you use. Also tell them if you smoke, drink alcohol, or use illegal drugs. Some items may interact with your medicine. What should I watch for while using this medicine? Use iron supplements only as directed by your health care professional. Dennis Bast will need important blood work while you are taking this medicine. It  may take 3 to 6 months of therapy to treat low iron levels. Pregnant women should follow the dose and length of iron treatment as directed by their doctors. Do not use iron longer than prescribed, and do not take a higher dose than recommended. Long-term use may cause excess iron to build-up in the body. Do not take iron with antacids. If you need to take an antacid, take it 2 hours after a dose  of iron. What side effects may I notice from receiving this medicine? Side effects that you should report to your doctor or health care professional as soon as possible: -allergic reactions like skin rash, itching or hives, swelling of the face, lips, or tongue -blue lips, nails, or palms -dark colored stools (this may be due to the iron, but can indicate a more serious condition) -drowsiness -pain with or difficulty swallowing -pale or clammy skin -seizures -stomach pain -unusually weak or tired -vomiting -weak, fast, or irregular heartbeat Side effects that usually do not require medical attention (report to your doctor or health care professional if they continue or are bothersome): -constipation -indigestion -nausea or stomach upset This list may not describe all possible side effects. Call your doctor for medical advice about side effects. You may report side effects to FDA at 1-800-FDA-1088. Where should I keep my medicine? Keep out of the reach of children. Even small amounts of iron can be harmful to a child. Store at room temperature between 15 and 30 degrees C (59 and 86 degrees F). Keep container tightly closed. Throw away any unused medicine after the expiration date. NOTE: This sheet is a summary. It may not cover all possible information. If you have questions about this medicine, talk to your doctor, pharmacist, or health care provider.  2015, Elsevier/Gold Standard. (2008-05-03 17:03:41)   Emergency Department Resource Guide 1) Find a Doctor and Pay Out of Pocket Although you won't have to find out who is covered by your insurance plan, it is a good idea to ask around and get recommendations. You will then need to call the office and see if the doctor you have chosen will accept you as a new patient and what types of options they offer for patients who are self-pay. Some doctors offer discounts or will set up payment plans for their patients who do not have insurance, but  you will need to ask so you aren't surprised when you get to your appointment.  2) Contact Your Local Health Department Not all health departments have doctors that can see patients for sick visits, but many do, so it is worth a call to see if yours does. If you don't know where your local health department is, you can check in your phone book. The CDC also has a tool to help you locate your state's health department, and many state websites also have listings of all of their local health departments.  3) Find a [facility] If your illness is not likely to be very severe or complicated, you may want to try a walk in clinic. These are popping up all over the country in pharmacies, drugstores, and shopping centers. They're usually staffed by nurse practitioners or physician assistants that have been trained to treat common illnesses and complaints. They're usually fairly quick and inexpensive. However, if you have serious medical issues or chronic medical problems, these are probably not your best option.  No Primary Care Doctor: - Call Health Connect at  [phone] - they can help you locate  a primary care doctor that  accepts your insurance, provides certain services, etc. - Physician Referral Service- [phone]  Chronic Pain Problems: Organization         Address  Phone   Notes  [facility]  [phone] Patients need to be referred by their primary care doctor.   Medication Assistance: Organization         Address  Phone   Notes  [facility] Medication [facility] [address] [place], [place] [address] [phone] --Must be a resident of [facility] -- Must have NO insurance coverage whatsoever (no Medicaid/ Medicare, etc.) -- The pt. MUST have a primary care doctor that directs their care regularly and follows them in the community   MedAssist  [phone]   [employer]  [phone]    Agencies that provide inexpensive  medical care: Organization         Address  Phone   Notes  [place]  [phone]   [facility]    [phone]   [facility] [place], [place] [address] [phone]   [place] [address] [place] [phone]   Planned Parenthood    [phone]   [facility]    [phone]   [place] and [address], [place] Phone:  [phone], Fax:  [phone] Hours of Operation:  9 am - 6 pm, M-F.  Also accepts Medicaid/Medicare and self-pay.  [facility] for [address], [address], [place] Phone: [phone], Fax: [phone]. Hours of Operation:  8:30 am - 5:30 pm, M-F.  Also accepts Medicaid and self-pay.  [facility] [place] [address] [place] Phone: [phone]   [place], [place], [place] [phone], Ext. [phone] Mondays & Thursdays: 7-9 AM.  First 15 patients are seen on a first come, first serve basis.    [place] Providers:  Organization         Address  Phone   Notes  [facility] [address],  [phone] Also accepts self-pay patients.  [facility] [address], [place]  [phone]   [place], [address], [place] [phone]   [facility] [address], [place] [phone]   Lucianne Lei [address], [place]   [phone] Only accepts [place] Access [place] patients after they have their name applied to their card.   Self-Pay (no insurance) in [facility]:  Organization         Address  Phone   Notes  Sickle Cell Patients, [facility] [place] [phone]   [facility] [place] [phone]   [facility] [place]  [place], [place], [place] [phone]   [facility]/Dr. Osei-Bonsu  [address], [place] or [address], [place] [phone] Phone number for both [place] and [place] locations is the same.  [facility] [address], [place] [phone]   [facility] [address] [place] or [address] [phone] (  [phone]   [place] [phone], phone; [phone], fax Sees patients 1st and 3rd Saturday of every month.  Must not qualify for public or private insurance (i.e. Medicaid, Medicare, [place] Health Choice, Veterans' Benefits)  Household income should be no more than 200% of the poverty level The clinic cannot treat you if you are pregnant or think you are pregnant  Sexually transmitted diseases are not treated at the clinic.    Dental Care: Organization         Address  Phone  Notes  [facility] [place] [phone] Accepts children up to age 46 who are enrolled in [place] or [place]; pregnant women with a Medicaid card; and children who have applied for Medicaid or [place] Health Choice, but were declined, whose parents can pay a reduced fee at time of service.  [facility]  [address], [place] [phone] Accepts children up to age 50 who are enrolled in [place] or [place]; pregnant women with a Medicaid card; and children who have applied for Medicaid or [place] Health Choice, but were declined, whose parents can pay a reduced fee at time of service.  [place] Adult Dental Access PROGRAM  [place] [phone] Patients are seen by appointment only. Walk-ins are not accepted.  [place] will see patients 49 years of age and older. Monday - Tuesday (8am-5pm) Most Wednesdays (8:30-5pm) $30 per visit, cash only  [facility] Adult Dental Access PROGRAM  [address], [place] [facility] [phone] Patients are seen by appointment only. Walk-ins are not accepted. [place] will see patients 59 years of age and older. One Wednesday Evening (Monthly: Volunteer Based).  $30 per visit, cash only  [place]  [phone] for adults; Children under age 87, call Graduate Pediatric Dentistry at [phone]. Children aged 70-14, please call [phone] to request a pediatric application.  Dental services are provided in all areas of dental care including fillings, crowns and bridges, complete and partial dentures, implants, gum treatment, root canals, and extractions. Preventive care is also provided. Treatment is provided to both adults and children. Patients are selected via a lottery and there is often a waiting list.   [facility] [address], [place]  [phone] [email or web address]   [facility] [address] [place], [place] [phone], Ext. [phone] Second and Fourth Thursday of each month, opens at 6:30 AM; Clinic ends at 9 AM.  Patients are seen on a first-come first-served basis, and a limited number are seen during each clinic.   [facility]  [address] Hillard Danker [place], [place] [phone]   Eligibility Requirements You must have lived in [place], [place], or [place] counties for at least the last three months.   You cannot be eligible for state or federal sponsored [employer], including [employer], [place], or [employer].   You generally cannot be eligible for healthcare insurance through your employer.    How to apply: Eligibility screenings are held every Tuesday and Wednesday afternoon from 1:00 pm until 4:00 pm. You do not need an appointment for the  interview!  [facility] [address] [place], [place]   [place]  [phone]   [place] Department  [phone]  [place] in the Community: Intensive Outpatient Programs Organization         Address  Phone  Notes  [place]. [address] [place], [place] [phone]   [facility] Outpatient [address], [place], [place]   ADS: Alcohol & Drug Svcs [address] [place], [place]   [place] [address],  [place], [place] or [phone]   Substance Abuse Resources Organization         Address  Phone  Notes  Alcohol and Drug Services  [phone]   [place]  [phone]   [facility] [place]   Chinita Pester  [phone]   Residential & Outpatient Substance Abuse Program  [phone]   Psychological Services Organization         Address  Phone  Notes  [facility] [place]  [place]  [phone]   [place] [address] [place] or [phone]    Mobile Crisis Teams Organization         Address  Phone  Notes  [facility], Mobile Crisis Care Unit  [phone]   [facility]  [address] [place], [place]   [place] [address] [place] [phone]    Self-Help/Support Groups Organization         Address  Phone             Notes  [place]. of [place] - variety of support groups  [place] Call for more information  Narcotics Anonymous (NA), [facility] [address], [employer]  2 meetings at this location   Special educational needs teacher         Address  Phone  Notes  ASAP Residential Treatment  [place],    [place]  1-5181394172   [facility]  [address] [place] [address], [place], [place]   [place], [place] [phone] Admissions: 8am-3pm M-F  Incentives Substance [place] [address],    [place], [place] [phone]   [facility] [address] [place], [place], [place]   [facility] [address]  [place], [place]   [facility] Programs - Intensive Outpatient [address], [place], [place]   [facility] [place]  [place], [place] [phone] or [phone]   [facility] Treatment Services (RTS) [address], [place], [place] Accepts Medicaid  [facility] [place] [address],  [place] [phone] Substance Abuse/Addiction Treatment   [facility] Organization         Address  Phone  Notes  [facility]  [phone]   Domenic Schwab, PhD [address] Arlis Porta [place], [place]   [phone] or [phone]   [place], [place] [phone]   [place] [address], [place], [place] [phone] Insurance/Medicaid/sponsorship through [employer] and Families [address], [place]                                    [place], [place] [phone] Therapy/tele-psych/case  [facility] [place], [place] [phone]    Dr. Adele Schilder  [phone]   [facility] Dept. 1) [address] [place] 2) [place] 3)  [address], [place] [phone]  [phone]   [place] [phone] or [phone] (After Hours)

## 2015-02-27 NOTE — ED Notes (Signed)
Patient refused to leave BP cuff and SpO2 monitor on. Pt continued to pull them off.

## 2015-03-01 ENCOUNTER — Emergency Department (HOSPITAL_COMMUNITY)
Admission: EM | Admit: 2015-03-01 | Discharge: 2015-03-01 | Disposition: A | Discharge status: Home / Self Care | Payer: Self-pay | Attending: Emergency Medicine | Admitting: Emergency Medicine

## 2015-03-01 ENCOUNTER — Emergency Department (HOSPITAL_COMMUNITY): Payer: Self-pay

## 2015-03-01 ENCOUNTER — Encounter (HOSPITAL_COMMUNITY): Payer: Self-pay

## 2015-03-01 DIAGNOSIS — R51 Headache: Secondary | ICD-10-CM | POA: Insufficient documentation

## 2015-03-01 DIAGNOSIS — Z720 Tobacco use: Secondary | ICD-10-CM | POA: Insufficient documentation

## 2015-03-01 DIAGNOSIS — Z8659 Personal history of other mental and behavioral disorders: Secondary | ICD-10-CM | POA: Insufficient documentation

## 2015-03-01 DIAGNOSIS — Z91040 Latex allergy status: Secondary | ICD-10-CM | POA: Insufficient documentation

## 2015-03-01 DIAGNOSIS — Z79899 Other long term (current) drug therapy: Secondary | ICD-10-CM | POA: Insufficient documentation

## 2015-03-01 DIAGNOSIS — R5383 Other fatigue: Secondary | ICD-10-CM | POA: Insufficient documentation

## 2015-03-01 DIAGNOSIS — R519 Headache, unspecified: Secondary | ICD-10-CM

## 2015-03-01 LAB — CBC WITH DIFFERENTIAL/PLATELET
Basophils Absolute: 0.1 10*3/uL (ref 0.0–0.1)
Basophils Relative: 1 % (ref 0–1)
EOS PCT: 10 % — AB (ref 0–5)
Eosinophils Absolute: 0.6 10*3/uL (ref 0.0–0.7)
HEMATOCRIT: 29.1 % — AB (ref 36.0–46.0)
Hemoglobin: 8.5 g/dL — ABNORMAL LOW (ref 12.0–15.0)
Lymphocytes Relative: 39 % (ref 12–46)
Lymphs Abs: 2.2 10*3/uL (ref 0.7–4.0)
MCH: 21 pg — ABNORMAL LOW (ref 26.0–34.0)
MCHC: 29.2 g/dL — ABNORMAL LOW (ref 30.0–36.0)
MCV: 71.9 fL — AB (ref 78.0–100.0)
Monocytes Absolute: 0.6 10*3/uL (ref 0.1–1.0)
Monocytes Relative: 11 % (ref 3–12)
NEUTROS ABS: 2.2 10*3/uL (ref 1.7–7.7)
Neutrophils Relative %: 39 % — ABNORMAL LOW (ref 43–77)
PLATELETS: 384 10*3/uL (ref 150–400)
RBC: 4.05 MIL/uL (ref 3.87–5.11)
RDW: 17.8 % — ABNORMAL HIGH (ref 11.5–15.5)
WBC: 5.7 10*3/uL (ref 4.0–10.5)

## 2015-03-01 LAB — BASIC METABOLIC PANEL
ANION GAP: 3 — AB (ref 5–15)
BUN: 7 mg/dL (ref 6–23)
CO2: 26 mmol/L (ref 19–32)
Calcium: 8.5 mg/dL (ref 8.4–10.5)
Chloride: 109 mmol/L (ref 96–112)
Creatinine, Ser: 0.6 mg/dL (ref 0.50–1.10)
GFR calc Af Amer: >90 mL/min (ref >90)
GFR calc non Af Amer: >90 mL/min (ref >90)
Glucose, Bld: 90 mg/dL (ref 70–99)
Potassium: 3.6 mmol/L (ref 3.5–5.1)
Sodium: 138 mmol/L (ref 135–145)

## 2015-03-01 LAB — URINALYSIS, ROUTINE W REFLEX MICROSCOPIC
Bilirubin Urine: NEGATIVE
Glucose, UA: NEGATIVE mg/dL
Ketones, ur: NEGATIVE mg/dL
LEUKOCYTES UA: NEGATIVE
Nitrite: NEGATIVE
PROTEIN: NEGATIVE mg/dL
Specific Gravity, Urine: 1.005 (ref 1.005–1.030)
Urobilinogen, UA: 0.2 mg/dL (ref 0.0–1.0)
pH: 7.5 (ref 5.0–8.0)

## 2015-03-01 LAB — URINE MICROSCOPIC-ADD ON

## 2015-03-01 MED ORDER — SODIUM CHLORIDE 0.9 % IV BOLUS (SEPSIS)
1000.0000 mL | Freq: Once | INTRAVENOUS | Status: AC
  Administered 2015-03-01: 1000 mL via INTRAVENOUS

## 2015-03-01 MED ORDER — KETOROLAC TROMETHAMINE 30 MG/ML IJ SOLN
30.0000 mg | Freq: Once | INTRAMUSCULAR | Status: AC
  Administered 2015-03-01: 30 mg via INTRAVENOUS
  Filled 2015-03-01: qty 1

## 2015-03-01 MED ORDER — METOCLOPRAMIDE HCL 5 MG/ML IJ SOLN
10.0000 mg | Freq: Once | INTRAMUSCULAR | Status: AC
  Administered 2015-03-01: 10 mg via INTRAVENOUS
  Filled 2015-03-01: qty 2

## 2015-03-01 MED ORDER — DIPHENHYDRAMINE HCL 50 MG/ML IJ SOLN
25.0000 mg | Freq: Once | INTRAMUSCULAR | Status: AC
  Administered 2015-03-01: 25 mg via INTRAVENOUS
  Filled 2015-03-01: qty 1

## 2015-03-01 NOTE — Discharge Instructions (Signed)

## 2015-03-01 NOTE — ED Provider Notes (Signed)
CSN: [id number]     Arrival date & time 03/01/15  2119 History   First MD Initiated Contact with Patient 03/01/15 1028     Chief Complaint  Patient presents with  . Headache     (Consider location/radiation/quality/duration/timing/severity/associated sxs/prior Treatment) HPI Comments: Patient presents to the emergency department with chief complaint of headache. She states that she has had a headache for the past 3 days. She reports having a seizure on Sunday, and was seen at [facility] emergency department. She states that she has felt tired and fatigued for the past several days. She denies any chest pain, shortness breath, cough, nausea, vomiting, diarrhea, or constipation. She reports some photophobia and phonophobia. She states that the headache has gradually worsened. It was not sudden in onset. She denies any ataxia. Denies any fevers chills.  The history is provided by the patient. No language interpreter was used.    Past Medical History  Diagnosis Date  . Bipolar 1 disorder   . Seizures     Pt reports none in the last 10 years  . Depression    Past Surgical History  Procedure Laterality Date  . No past surgeries     History reviewed. No pertinent family history. History  Substance Use Topics  . Smoking status: Current Every Day Smoker -- 0.50 packs/day    Types: Cigarettes  . Smokeless tobacco: Never Used  . Alcohol Use: 3.6 oz/week    6 Cans of beer per week   OB History    Gravida Para Term Preterm AB TAB SAB Ectopic Multiple Living   0              Review of Systems  Constitutional: Negative for fever and chills.  Respiratory: Negative for shortness of breath.   Cardiovascular: Negative for chest pain.  Gastrointestinal: Negative for nausea, vomiting, diarrhea and constipation.  Genitourinary: Negative for dysuria.  Neurological: Positive for headaches.  All other systems reviewed and are negative.     Allergies  Latex  Home Medications   Prior to  Admission medications   Medication Sig Start Date End Date Taking? Authorizing Provider  acetaminophen-codeine (TYLENOL #3) 300-30 MG per tablet Take 1 tablet by mouth every 6 (six) hours as needed for moderate pain.   Yes Historical Provider, MD  EPINEPHrine (EPI-PEN) 0.3 mg/0.3 mL SOAJ injection Inject 0.3 mg into the muscle once as needed (severe allergic reaction).   Yes Historical Provider, MD  ferrous sulfate 325 (65 FE) MG tablet Take 1 tablet (325 mg total) by mouth 2 (two) times daily with a meal. 04/16/39  Yes Delora Fuel, MD  ibuprofen (ADVIL,MOTRIN) 800 MG tablet Take 800 mg by mouth every 8 (eight) hours as needed for headache or mild pain.   Yes Historical Provider, MD  Ibuprofen-Diphenhydramine Cit (ADVIL PM PO) Take 1 tablet by mouth once.   Yes Historical Provider, MD   BP 131/92 mmHg  Pulse 75  Temp(Src) 97.6 F (36.4 C) (Oral)  Resp 18  SpO2 100%  LMP 02/23/2015 Physical Exam  Constitutional: She is oriented to person, place, and time. She appears well-developed and well-nourished.  HENT:  Head: Normocephalic and atraumatic.  Right Ear: External ear normal.  Left Ear: External ear normal.  Eyes: Conjunctivae and EOM are normal. Pupils are equal, round, and reactive to light.  Neck: Normal range of motion. Neck supple.  No pain with neck flexion, no meningismus  Cardiovascular: Normal rate, regular rhythm and normal heart sounds.  Exam reveals no  gallop and no friction rub.   No murmur heard. Pulmonary/Chest: Effort normal and breath sounds normal. No respiratory distress. She has no wheezes. She has no rales. She exhibits no tenderness.  Abdominal: Soft. Bowel sounds are normal. She exhibits no distension and no mass. There is no tenderness. There is no rebound and no guarding.  Musculoskeletal: Normal range of motion. She exhibits no edema or tenderness.  Normal gait.  Neurological: She is alert and oriented to person, place, and time. She has normal reflexes.  CN  3-12 intact, normal finger to nose, no pronator drift, sensation and strength intact bilaterally.  Skin: Skin is warm and dry.  Psychiatric: She has a normal mood and affect. Her behavior is normal. Judgment and thought content normal.  Nursing note and vitals reviewed.   ED Course  Procedures (including critical care time) Results for orders placed or performed during the hospital encounter of 03/01/15  CBC with Differential/Platelet  Result Value Ref Range   WBC 5.7 4.0 - 10.5 K/uL   RBC 4.05 3.87 - 5.11 MIL/uL   Hemoglobin 8.5 (L) 12.0 - 15.0 g/dL   HCT 29.1 (L) 36.0 - 46.0 %   MCV 71.9 (L) 78.0 - 100.0 fL   MCH 21.0 (L) 26.0 - 34.0 pg   MCHC 29.2 (L) 30.0 - 36.0 g/dL   RDW 17.8 (H) 11.5 - 15.5 %   Platelets 384 150 - 400 K/uL   Neutrophils Relative % 39 (L) 43 - 77 %   Lymphocytes Relative 39 12 - 46 %   Monocytes Relative 11 3 - 12 %   Eosinophils Relative 10 (H) 0 - 5 %   Basophils Relative 1 0 - 1 %   Neutro Abs 2.2 1.7 - 7.7 K/uL   Lymphs Abs 2.2 0.7 - 4.0 K/uL   Monocytes Absolute 0.6 0.1 - 1.0 K/uL   Eosinophils Absolute 0.6 0.0 - 0.7 K/uL   Basophils Absolute 0.1 0.0 - 0.1 K/uL   RBC Morphology POLYCHROMASIA PRESENT   Basic metabolic panel  Result Value Ref Range   Sodium 138 135 - 145 mmol/L   Potassium 3.6 3.5 - 5.1 mmol/L   Chloride 109 96 - 112 mmol/L   CO2 26 19 - 32 mmol/L   Glucose, Bld 90 70 - 99 mg/dL   BUN 7 6 - 23 mg/dL   Creatinine, Ser 0.60 0.50 - 1.10 mg/dL   Calcium 8.5 8.4 - 10.5 mg/dL   GFR calc non Af Amer >90 >90 mL/min   GFR calc Af Amer >90 >90 mL/min   Anion gap 3 (L) 5 - 15  Urinalysis, Routine w reflex microscopic  Result Value Ref Range   Color, Urine YELLOW YELLOW   APPearance CLEAR CLEAR   Specific Gravity, Urine 1.005 1.005 - 1.030   pH 7.5 5.0 - 8.0   Glucose, UA NEGATIVE NEGATIVE mg/dL   Hgb urine dipstick TRACE (A) NEGATIVE   Bilirubin Urine NEGATIVE NEGATIVE   Ketones, ur NEGATIVE NEGATIVE mg/dL   Protein, ur NEGATIVE  NEGATIVE mg/dL   Urobilinogen, UA 0.2 0.0 - 1.0 mg/dL   Nitrite NEGATIVE NEGATIVE   Leukocytes, UA NEGATIVE NEGATIVE  Urine microscopic-add on  Result Value Ref Range   Squamous Epithelial / LPF RARE RARE   Ct Head Wo Contrast  03/01/2015   CLINICAL DATA:  Generalized weakness.  Bitemporal headache.  EXAM: CT HEAD WITHOUT CONTRAST  TECHNIQUE: Contiguous axial images were obtained from the base of the skull through the vertex without  intravenous contrast.  COMPARISON:  None.  FINDINGS: No evidence for acute infarction, hemorrhage, mass lesion, hydrocephalus, or extra-axial fluid. No atrophy or white matter disease. Calvarium intact. No sinus or mastoid disease. Negative orbits.  IMPRESSION: Negative exam.   Electronically Signed   By: Rolla Flatten M.D.   On: 03/01/2015 11:30   Ct Angio Chest Pe W/cm &/or Wo Cm  02/27/2015   CLINICAL DATA:  Tachycardia; could not catch breath. Seizure-like activity. Took four caffeine pills, an energy drink and a beer. Initial encounter.  EXAM: CT ANGIOGRAPHY CHEST WITH CONTRAST  TECHNIQUE: Multidetector CT imaging of the chest was performed using the standard protocol during bolus administration of intravenous contrast. Multiplanar CT image reconstructions and MIPs were obtained to evaluate the vascular anatomy.  CONTRAST:  171mL OMNIPAQUE IOHEXOL 350 MG/ML SOLN  COMPARISON:  Chest radiograph performed 02/26/2015  FINDINGS: There is no evidence of significant pulmonary embolus. Evaluation for pulmonary embolus is suboptimal due to limitations in the timing of the contrast bolus.  Minimal bilateral atelectasis is noted. The lungs are otherwise clear. There is no evidence of significant focal consolidation, pleural effusion or pneumothorax. No masses are identified; no abnormal focal contrast enhancement is seen.  The mediastinum is unremarkable in appearance. No mediastinal lymphadenopathy is seen. No pericardial effusion is identified. The great vessels are grossly  unremarkable in appearance. No axillary lymphadenopathy is seen. Multiple hypodense and heterogeneous nodules are seen in the thyroid gland, measuring up to 1.7 cm in size.  The visualized portions of the liver and spleen are unremarkable. The visualized portions of the pancreas, gallbladder, stomach, adrenal glands and kidneys are within normal limits.  No acute osseous abnormalities are seen.  Review of the MIP images confirms the above findings.  IMPRESSION: 1. No evidence of significant pulmonary embolus. 2. Minimal bilateral atelectasis noted; lungs otherwise clear. 3. Multiple hypodense and heterogeneous nodules in the thyroid gland, measuring up to 1.7 cm in size. Consider further evaluation with thyroid ultrasound. If patient is clinically hyperthyroid, consider nuclear medicine thyroid uptake and scan.   Electronically Signed   By: Garald Balding M.D.   On: 02/27/2015 02:06   Dg Chest Port 1 View  02/26/2015   CLINICAL DATA:  Chest pain.  Seizure today with headache.  EXAM: PORTABLE CHEST - 1 VIEW  COMPARISON:  None.  FINDINGS: Normal heart size and mediastinal contours. No acute infiltrate or edema. No effusion or pneumothorax. Remote right AC joint dislocation with heterotopic opacification between the coracoid and clavicle. No acute osseous findings.  IMPRESSION: No active disease.   Electronically Signed   By: Monte Fantasia M.D.   On: 02/26/2015 23:59     Imaging Review Ct Head Wo Contrast  03/01/2015   CLINICAL DATA:  Generalized weakness.  Bitemporal headache.  EXAM: CT HEAD WITHOUT CONTRAST  TECHNIQUE: Contiguous axial images were obtained from the base of the skull through the vertex without intravenous contrast.  COMPARISON:  None.  FINDINGS: No evidence for acute infarction, hemorrhage, mass lesion, hydrocephalus, or extra-axial fluid. No atrophy or white matter disease. Calvarium intact. No sinus or mastoid disease. Negative orbits.  IMPRESSION: Negative exam.   Electronically Signed    By: Rolla Flatten M.D.   On: 03/01/2015 11:30     EKG Interpretation None      MDM   Final diagnoses:  Headache, unspecified headache type   Patient with headache and reported recent seizure. I reviewed prior ED notes, the patient did not have a seizure, but  rather was tachycardic secondary to taking caffeine supplements. She states that the headache has persisted, and history is unclear, will check head CT.  Head CT is negative. Will treat with headache cocktail, and will reassess.  CT is negative. Doubt that the patient ever had a seizure, and reviewing prior ED notes, there is no mention of this. Labs are remarkable for anemia.  This has been present before, and was stressed at her last visit. She states that she is going to start taking iron supplements. I will encourage her to return if she has any worsening symptoms, including dizziness, or weakness. Patient understands and agrees with the plan. She is stable and ready for discharge.  Patient discussed with Dr. Sabra Heck, who agrees with the plan.    Montine Circle, PA-C 03/01/15 1309  Johnna Acosta, MD 03/01/15 2115

## 2015-03-01 NOTE — ED Provider Notes (Signed)
CSN: [id number]     Arrival date & time 02/26/15  2200 History   First MD Initiated Contact with Patient 02/26/15 2206     Chief Complaint  Patient presents with  . Tachycardia     (Consider location/radiation/quality/duration/timing/severity/associated sxs/prior Treatment) HPI  34 year old female, history of bipolar disorder, possibly a distant history of seizures, presents to the hospital with a complaint of a rapid heart rate and feeling anxious and tachypneic. The patient was initially very tachycardic with a pulse in the 130 range, sometimes this would go down to less than 100, she is to keep neck and hyperventilating. This occurred after taking multiple doses of caffeine stimulants, as well as drinking a significant amount of alcohol prior to arrival. She denies lower extremity swelling, generalized weakness, fatigue, lightheadedness, headaches, seizures, syncope. Her tachycardia is persistent.  Past Medical History  Diagnosis Date  . Bipolar 1 disorder   . Seizures     Pt reports none in the last 10 years  . Depression    Past Surgical History  Procedure Laterality Date  . No past surgeries     History reviewed. No pertinent family history. History  Substance Use Topics  . Smoking status: Current Every Day Smoker -- 0.50 packs/day    Types: Cigarettes  . Smokeless tobacco: Never Used  . Alcohol Use: 3.6 oz/week    6 Cans of beer per week   OB History    Gravida Para Term Preterm AB TAB SAB Ectopic Multiple Living   0              Review of Systems  All other systems reviewed and are negative.     Allergies  Latex  Home Medications   Prior to Admission medications   Medication Sig Start Date End Date Taking? Authorizing Provider  EPINEPHrine (EPI-PEN) 0.3 mg/0.3 mL SOAJ injection Inject 0.3 mg into the muscle once as needed (severe allergic reaction).   Yes Historical Provider, MD  acetaminophen-codeine (TYLENOL #3) 300-30 MG per tablet Take 1 tablet by  mouth every 6 (six) hours as needed for moderate pain.    Historical Provider, MD  ferrous sulfate 325 (65 FE) MG tablet Take 1 tablet (325 mg total) by mouth 2 (two) times daily with a meal. 05/15/60   Delora Fuel, MD  ibuprofen (ADVIL,MOTRIN) 800 MG tablet Take 800 mg by mouth every 8 (eight) hours as needed for headache or mild pain.    Historical Provider, MD  Ibuprofen-Diphenhydramine Cit (ADVIL PM PO) Take 1 tablet by mouth once.    Historical Provider, MD   BP 105/58 mmHg  Pulse 99  Temp(Src) 97.8 F (36.6 C) (Oral)  Resp 18  Ht 5\' 5"  (1.651 m)  Wt 176 lb (79.833 kg)  BMI 29.29 kg/m2  SpO2 99%  LMP 02/22/2015 Physical Exam  Constitutional: She appears well-developed and well-nourished. No distress.  HENT:  Head: Normocephalic and atraumatic.  Mouth/Throat: Oropharynx is clear and moist. No oropharyngeal exudate.  Eyes: Conjunctivae and EOM are normal. Pupils are equal, round, and reactive to light. Right eye exhibits no discharge. Left eye exhibits no discharge. No scleral icterus.  Neck: Normal range of motion. Neck supple. No JVD present. No thyromegaly present.  Cardiovascular: Regular rhythm, normal heart sounds and intact distal pulses.  Exam reveals no gallop and no friction rub.   No murmur heard. Tachycardia present, normal pulses at the radial arteries, no JVD  Pulmonary/Chest: Effort normal and breath sounds normal. No respiratory distress. She has no  wheezes. She has no rales.  Hyperventilating, tachypneic  Abdominal: Soft. Bowel sounds are normal. She exhibits no distension and no mass. There is no tenderness.  Musculoskeletal: Normal range of motion. She exhibits no edema or tenderness.  Lymphadenopathy:    She has no cervical adenopathy.  Neurological: She is alert. Coordination normal.  Skin: Skin is warm and dry. No rash noted. No erythema.  Psychiatric: She has a normal mood and affect. Her behavior is normal.  Nursing note and vitals reviewed.   ED Course   Procedures (including critical care time) Labs Review Labs Reviewed  CBC - Abnormal; Notable for the following:    WBC 10.6 (*)    Hemoglobin 8.5 (*)    HCT 28.4 (*)    MCV 70.0 (*)    MCH 20.9 (*)    MCHC 29.9 (*)    RDW 17.9 (*)    Platelets 401 (*)    All other components within normal limits  I-STAT CHEM 8, ED - Abnormal; Notable for the following:    Glucose, Bld 106 (*)    Calcium, Ion 1.01 (*)    Hemoglobin 10.9 (*)    HCT 32.0 (*)    All other components within normal limits  I-STAT TROPOININ, ED    Imaging Review No results found.   EKG Interpretation   Date/Time:  Monday February 27 2015 00:38:54 EST Ventricular Rate:  127 PR Interval:  142 QRS Duration: 76 QT Interval:  321 QTC Calculation: 467 R Axis:   88 Text Interpretation:  Sinus tachycardia Borderline repolarization  abnormality Baseline wander in lead(s) II III aVR aVL aVF V1 V2 V3 V4 ED  PHYSICIAN INTERPRETATION AVAILABLE IN [facility] Confirmed by TEST,  Record ([id number]) on 03/01/2015 7:14:58 AM      MDM   Final diagnoses:  Chest pain  Sinus tachycardia  Iron deficiency anemia  Menorrhagia with regular cycle  Rectal bleeding  Thyroid nodule    At the end of the visit prior to change of shift the patient was informed that she would need a CT angiogram as well as gastroenterology workup as she is anemic, she now reports that she is having rectal bleeding for the last 3 months, she declines to have this evaluated today. At change of shift the patient was signed out to Dr. Roxanne Mins.  She was given Ativan which helped with anxiety and tachypnea, IV fluids as well   Johnna Acosta, MD 03/01/15 [phone]

## 2015-03-01 NOTE — ED Notes (Signed)
Pt seen at [place] on Sunday for new onset seizure.  Told to follow up with neurology.  Pt presents today with headache since Sunday.

## 2015-03-01 NOTE — ED Notes (Signed)
Patient transported to CT 

## 2015-06-01 ENCOUNTER — Emergency Department (HOSPITAL_COMMUNITY)
Admission: EM | Admit: 2015-06-01 | Discharge: 2015-06-01 | Disposition: A | Discharge status: Home / Self Care | Payer: Worker's Compensation | Attending: Emergency Medicine | Admitting: Emergency Medicine

## 2015-06-01 ENCOUNTER — Emergency Department (HOSPITAL_COMMUNITY): Payer: Worker's Compensation

## 2015-06-01 ENCOUNTER — Encounter (HOSPITAL_COMMUNITY): Payer: Self-pay

## 2015-06-01 DIAGNOSIS — Y990 Civilian activity done for income or pay: Secondary | ICD-10-CM | POA: Insufficient documentation

## 2015-06-01 DIAGNOSIS — Y9289 Other specified places as the place of occurrence of the external cause: Secondary | ICD-10-CM | POA: Diagnosis not present

## 2015-06-01 DIAGNOSIS — W1841XA Slipping, tripping and stumbling without falling due to stepping on object, initial encounter: Secondary | ICD-10-CM | POA: Diagnosis not present

## 2015-06-01 DIAGNOSIS — Y9389 Activity, other specified: Secondary | ICD-10-CM | POA: Insufficient documentation

## 2015-06-01 DIAGNOSIS — S93401A Sprain of unspecified ligament of right ankle, initial encounter: Secondary | ICD-10-CM | POA: Diagnosis not present

## 2015-06-01 DIAGNOSIS — Z720 Tobacco use: Secondary | ICD-10-CM | POA: Diagnosis not present

## 2015-06-01 DIAGNOSIS — Z8659 Personal history of other mental and behavioral disorders: Secondary | ICD-10-CM | POA: Insufficient documentation

## 2015-06-01 DIAGNOSIS — S99911A Unspecified injury of right ankle, initial encounter: Secondary | ICD-10-CM | POA: Diagnosis present

## 2015-06-01 DIAGNOSIS — Z91040 Latex allergy status: Secondary | ICD-10-CM | POA: Insufficient documentation

## 2015-06-01 MED ORDER — IBUPROFEN 800 MG PO TABS
800.0000 mg | ORAL_TABLET | Freq: Once | ORAL | Status: AC
  Administered 2015-06-01: 800 mg via ORAL
  Filled 2015-06-01: qty 1

## 2015-06-01 MED ORDER — CYCLOBENZAPRINE HCL 10 MG PO TABS
5.0000 mg | ORAL_TABLET | Freq: Once | ORAL | Status: AC
  Administered 2015-06-01: 5 mg via ORAL
  Filled 2015-06-01: qty 1

## 2015-06-01 MED ORDER — CYCLOBENZAPRINE HCL 5 MG PO TABS: 5.0000 mg | ORAL_TABLET | Freq: Two times a day (BID) | ORAL | Status: DC | PRN

## 2015-06-01 MED ORDER — NAPROXEN 375 MG PO TABS: 375.0000 mg | ORAL_TABLET | Freq: Two times a day (BID) | ORAL | Status: DC

## 2015-06-01 MED ORDER — HYDROCODONE-ACETAMINOPHEN 5-325 MG PO TABS: 1.0000 | ORAL_TABLET | ORAL | Status: DC | PRN

## 2015-06-01 MED ORDER — OXYCODONE-ACETAMINOPHEN 5-325 MG PO TABS
1.0000 | ORAL_TABLET | Freq: Once | ORAL | Status: AC
  Administered 2015-06-01: 1 via ORAL
  Filled 2015-06-01: qty 1

## 2015-06-01 NOTE — ED Notes (Signed)
Patient states she stepped on a pallet at work and "turned " her right foot. Patient has an ace wrap on her right ankle. Patient has increased pain when weight bearing.

## 2015-06-01 NOTE — Discharge Instructions (Signed)

## 2015-06-01 NOTE — ED Provider Notes (Signed)
CSN: [id number]     Arrival date & time 06/01/15  1823 History  This chart was scribed for non-physician practitioner Delos Haring, PA-C working with Noemi Chapel, MD by Rayfield Citizen, ED Scribe. This patient was seen in room WTR5/WTR5 and the patient's care was started at 8:23 PM.    Chief Complaint  Patient presents with  . Ankle Injury   The history is provided by the patient. No language interpreter was used.     HPI Comments: Samantha Howard is a 34 y.o. female who presents to the Emergency Department complaining of gradually worsening right ankle pain, exacerbated with weight bearing. Patient explains she stepped off a pallet at work two days [employer] and "turned" her ankle. She states she "turned" it again today in a similar manner. She has been managing her pain with ibuprofen; she has not yet iced or elevated the ankle. Non other treatments or medications today. She denies any weakness or numbness to the right foot, denies other concerns at this time.   No syncope, LOC or head or neck pain.  Past Medical History  Diagnosis Date  . Bipolar 1 disorder   . Seizures     Pt reports none in the last 10 years  . Depression    Past Surgical History  Procedure Laterality Date  . No past surgeries     History reviewed. No pertinent family history. History  Substance Use Topics  . Smoking status: Current Every Day Smoker -- 1.00 packs/day    Types: Cigars  . Smokeless tobacco: Never Used  . Alcohol Use: 3.6 oz/week    6 Cans of beer per week     Comment: socially   OB History    Gravida Para Term Preterm AB TAB SAB Ectopic Multiple Living   0              Review of Systems  Constitutional: Negative for fever and chills.  Musculoskeletal: Positive for arthralgias (Right ankle pain).  Neurological: Negative for weakness and numbness.  All other systems reviewed and are negative.  Allergies  Latex  Home Medications   Prior to Admission medications   Medication Sig Start  Date End Date Taking? Authorizing Provider  ibuprofen (ADVIL,MOTRIN) 200 MG tablet Take 200 mg by mouth every 6 (six) hours as needed for moderate pain (pain).   Yes Historical Provider, MD  EPINEPHrine (EPI-PEN) 0.3 mg/0.3 mL SOAJ injection Inject 0.3 mg into the muscle once as needed (severe allergic reaction).    Historical Provider, MD  ferrous sulfate 325 (65 FE) MG tablet Take 1 tablet (325 mg total) by mouth 2 (two) times daily with a meal. Patient not taking: Reported on 06/01/2015 3/82/50   Delora Fuel, MD   BP 539/76 mmHg  Pulse 79  Temp(Src) 98.3 F (36.8 C) (Oral)  Resp 18  Ht 5\' 6"  (1.676 m)  Wt 184 lb (83.462 kg)  BMI 29.71 kg/m2  SpO2 100%  LMP 05/25/2015 Physical Exam  Constitutional: She is oriented to person, place, and time. She appears well-developed and well-nourished.  HENT:  Head: Normocephalic and atraumatic.  Neck: No tracheal deviation present.  Cardiovascular: Normal rate.   Pulmonary/Chest: Effort normal.  Musculoskeletal: She exhibits tenderness.  Swelling to the right lateral malleolus. Tenderness to bilateral ankle with associated swelling. Achilles intact. No deformity or disruption to the skin. Pedal pulses are symmetrical. Cap refill< 2 secs to all five toes. NVI.   Neurological: She is alert and oriented to person, place, and time.  Skin: Skin is warm and dry.  Psychiatric: She has a normal mood and affect. Her behavior is normal.  Nursing note and vitals reviewed.   ED Course  Procedures   DIAGNOSTIC STUDIES: Oxygen Saturation is 99% on RA, normal by my interpretation.    COORDINATION OF CARE: 8:26 PM Discussed treatment plan with pt at bedside and pt agreed to plan.   Labs Review Labs Reviewed - No data to display  Imaging Review Dg Ankle Complete Right  06/01/2015   CLINICAL DATA:  Stepped off pallet at work and rolled right ankle, with diffuse right ankle pain. Initial encounter.  EXAM: RIGHT ANKLE - COMPLETE 3+ VIEW  COMPARISON:   None.  FINDINGS: There is no evidence of fracture or dislocation. The ankle mortise is intact. No talar tilt or subluxation is seen. Dense calcification is noted along the interosseous space.  The joint spaces are preserved. Mild lateral soft tissue swelling is noted.  IMPRESSION: 1. No evidence of fracture or dislocation. 2. Dense calcification along the interosseous space likely reflects remote injury.   Electronically Signed   By: Garald Balding M.D.   On: 06/01/2015 20:52     EKG Interpretation None      MDM   Final diagnoses:  Ankle sprain, right, initial encounter    Xray shows remote injury but no new fracture. Area of tenderness is at the area of mild lateral soft tissue swelling. Will give Cam-walker and crutches. RICE recommended Muscle relaxers and NSAIDs, a few tabs of Vicodin for break through pain. Referral to Ortho.  Medications  oxyCODONE-acetaminophen (PERCOCET/ROXICET) 5-325 MG per tablet 1 tablet (1 tablet Oral Given 06/01/15 2052)  ibuprofen (ADVIL,MOTRIN) tablet 800 mg (800 mg Oral Given 06/01/15 2052)  cyclobenzaprine (FLEXERIL) tablet 5 mg (5 mg Oral Given 06/01/15 2052)    34 y.o.Samantha Howard's evaluation in the Emergency Department is complete. It has been determined that no acute conditions requiring further emergency intervention are present at this time. The patient/guardian have been advised of the diagnosis and plan. We have discussed signs and symptoms that warrant return to the ED, such as changes or worsening in symptoms.  Vital signs are stable at discharge. Filed Vitals:   06/01/15 2054  BP: 129/94  Pulse: 79  Temp:   Resp: 18    Patient/guardian has voiced understanding and agreed to follow-up with the PCP or specialist.   I personally performed the services described in this documentation, which was scribed in my presence. The recorded information has been reviewed and is accurate.      Delos Haring, PA-C 06/01/15 2105  Noemi Chapel, MD 06/02/15 [phone]

## 2015-12-05 ENCOUNTER — Other Ambulatory Visit: Payer: Self-pay | Admitting: Obstetrics and Gynecology

## 2015-12-21 ENCOUNTER — Encounter (HOSPITAL_COMMUNITY)
Admission: RE | Admit: 2015-12-21 | Discharge: 2015-12-21 | Disposition: A | Discharge status: Home / Self Care | Payer: BLUE CROSS/BLUE SHIELD | Source: Ambulatory Visit | Attending: Obstetrics and Gynecology | Admitting: Obstetrics and Gynecology

## 2015-12-21 ENCOUNTER — Encounter (HOSPITAL_COMMUNITY): Payer: Self-pay

## 2015-12-21 DIAGNOSIS — Z01812 Encounter for preprocedural laboratory examination: Secondary | ICD-10-CM | POA: Diagnosis present

## 2015-12-21 DIAGNOSIS — D259 Leiomyoma of uterus, unspecified: Secondary | ICD-10-CM | POA: Diagnosis not present

## 2015-12-21 HISTORY — DX: Anemia, unspecified: D64.9

## 2015-12-21 LAB — CBC
HEMATOCRIT: 28.9 % — AB (ref 36.0–46.0)
Hemoglobin: 8.4 g/dL — ABNORMAL LOW (ref 12.0–15.0)
MCH: 20.5 pg — AB (ref 26.0–34.0)
MCHC: 29.1 g/dL — ABNORMAL LOW (ref 30.0–36.0)
MCV: 70.5 fL — AB (ref 78.0–100.0)
Platelets: 344 10*3/uL (ref 150–400)
RBC: 4.1 MIL/uL (ref 3.87–5.11)
RDW: 17.4 % — AB (ref 11.5–15.5)
WBC: 6.5 10*3/uL (ref 4.0–10.5)

## 2015-12-21 NOTE — Patient Instructions (Signed)
Your procedure is scheduled on:  December 27, 2015  Enter through the Main Entrance of [facility] at: 6:00 am   Pick up the phone at the desk and dial [phone].  Call this number if you have problems the morning of surgery: [phone].  Remember: Do NOT eat food: after midnight on Tuesday  Do NOT drink clear liquids after: midnight on Tuesday   Take these medicines the morning of surgery with a SIP OF WATER: none   Do NOT wear jewelry (body piercing), metal hair clips/bobby pins, make-up, or nail polish. Do NOT wear lotions, powders, or perfumes.  You may wear deoderant. Do NOT shave for 48 hours prior to surgery. Do NOT bring valuables to the hospital. Contacts, dentures, or bridgework may not be worn into surgery. Leave suitcase in car.  After surgery it may be brought to your room.  For patients admitted to the hospital, checkout time is 11:00 AM the day of discharge.

## 2015-12-24 ENCOUNTER — Encounter (HOSPITAL_COMMUNITY): Payer: Self-pay | Admitting: Anesthesiology

## 2015-12-24 NOTE — Anesthesia Preprocedure Evaluation (Addendum)
Anesthesia Evaluation  Patient identified by MRN, date of birth, ID band Patient awake    Reviewed: Allergy & Precautions, NPO status , Patient's Chart, lab work & pertinent test results  Airway Mallampati: II  TM Distance: >3 FB Neck ROM: Full    Dental no notable dental hx. (+) Teeth Intact   Pulmonary Current Smoker,    Pulmonary exam normal breath sounds clear to auscultation       Cardiovascular negative cardio ROS Normal cardiovascular exam Rhythm:Regular Rate:Normal     Neuro/Psych Seizures -, Well Controlled,  PSYCHIATRIC DISORDERS Depression Bipolar Disorder No Sz in 10 years    GI/Hepatic negative GI ROS, Neg liver ROS,   Endo/Other  negative endocrine ROS  Renal/GU negative Renal ROS  negative genitourinary   Musculoskeletal negative musculoskeletal ROS (+)   Abdominal   Peds  Hematology  (+) anemia ,   Anesthesia Other Findings   Reproductive/Obstetrics Uterine fibroids                            Anesthesia Physical Anesthesia Plan  ASA: II  Anesthesia Plan: General   Post-op Pain Management:    Induction: Intravenous  Airway Management Planned: Oral ETT  Additional Equipment:   Intra-op Plan:   Post-operative Plan: Extubation in OR  Informed Consent: I have reviewed the patients History and Physical, chart, labs and discussed the procedure including the risks, benefits and alternatives for the proposed anesthesia with the patient or authorized representative who has indicated his/her understanding and acceptance.   Dental advisory given  Plan Discussed with: CRNA, Anesthesiologist and Surgeon  Anesthesia Plan Comments:         Anesthesia Quick Evaluation

## 2015-12-26 NOTE — H&P (Signed)
34 y.o. yo G) complains of an enlarged symptomatic fibroid uterus.  At her first visit with me in August she stated that "she heavy cycles with having to change tampons every 20 minutes. Also when having cycles, she get a numbness in her legs. No inter-menstrual bleeding. Soaking thru sometimes even both tampon and pad. Will last 8 days go off and come back but not excessive. No other IM. No bleeding with SA. Not ever had [place]. Going on for last 4-5 years. Having a lot of pain as well, for the most part with periods."  Since she had been treated lupron in an effort to reduce bleeding while waiting for surgery.  Pt. Now states she is still bleeding, stopped 2 weeks ago for 3 days, heavy flow with clots and constant cramping pain. [place] 13x9x10, EM 1 cm. Large posterior fibroid 10 cm displacing EM. Ovaries normal size, R cyst simple with no increase blood flow- 1.8 cm.  EMB was benign.    Past Medical History  Diagnosis Date  . Bipolar 1 disorder ([place])   . Seizures ([place])     Pt reports none in the last 10 years  . Depression   . Anemia    Past Surgical History  Procedure Laterality Date  . No past surgeries      Social History   Social History  . Marital Status: Single    Spouse Name: N/A  . Number of Children: N/A  . Years of Education: N/A   Occupational History  . Not on file.   Social History Main Topics  . Smoking status: Current Every Day Smoker -- 1.00 packs/day    Types: Cigars  . Smokeless tobacco: Never Used  . Alcohol Use: 3.6 oz/week    6 Cans of beer per week     Comment: socially  . Drug Use: No  . Sexual Activity: Not on file   Other Topics Concern  . Not on file   Social History Narrative    No current facility-administered medications on file prior to encounter.   Current Outpatient Prescriptions on File Prior to Encounter  Medication Sig Dispense Refill  . cyclobenzaprine (FLEXERIL) 5 MG tablet Take 1 tablet (5 mg total) by mouth 2 (two) times daily as  needed for muscle spasms. (Patient not taking: Reported on 12/14/2015) 20 tablet 0  . EPINEPHrine (EPI-PEN) 0.3 mg/0.3 mL SOAJ injection Inject 0.3 mg into the muscle once as needed (severe allergic reaction).    . ferrous sulfate 325 (65 FE) MG tablet Take 1 tablet (325 mg total) by mouth 2 (two) times daily with a meal. (Patient not taking: Reported on 12/14/2015) 60 tablet 0  . HYDROcodone-acetaminophen (NORCO/VICODIN) 5-325 MG per tablet Take 1 tablet by mouth every 4 (four) hours as needed. (Patient not taking: Reported on 12/14/2015) 12 tablet 0  . ibuprofen (ADVIL,MOTRIN) 200 MG tablet Take 200 mg by mouth every 6 (six) hours as needed for moderate pain (pain).    . naproxen (NAPROSYN) 375 MG tablet Take 1 tablet (375 mg total) by mouth 2 (two) times daily. (Patient not taking: Reported on 12/14/2015) 20 tablet 0    Allergies  Allergen Reactions  . Latex Itching and Swelling    @VITALS2 @  Lungs: clear to ascultation Cor:  RRR Abdomen:  soft, nontender, nondistended. Ex:  no cords, erythema Pelvic:  : Vulva: no masses, atrophy, or lesions. Vagina: no tenderness, erythema, cystocele, rectocele, abnormal vaginal discharge, or vesicle(s) or ulcers. Cervix: no discharge or cervical motion  tenderness and grossly normal and sample taken for a Pap smear. Uterus: normal shape and size (17 wee size, tall and slightly bulky; knob in posterior cul de sac probably fibroid. Uterus is relatively mobile. Pt was very tender on exam, especially cul de sac.) and midline, non-tender, and no uterine prolapse. Bladder/Urethra: no urethral discharge or mass and normal meatus, bladder non distended, and Urethra well supported. Adnexa/Parametria: no parametrial tenderness or mass and no adnexal tenderness or ovarian mass.     A:  Symptomatic fibroid uterus with minimal relief with lupron.  Pt desires definitive and does not desire fertility.  To keep ovaries if normal.   P: All risks, benefits and  alternatives d/w patient and she desires to proceed.  Patient has undergone a modified bowel prep and will receive preop antibiotics and SCDs during the operation.     Samantha Howard A

## 2015-12-27 ENCOUNTER — Encounter (HOSPITAL_COMMUNITY)
Admission: RE | Disposition: A | Discharge status: Home / Self Care | Payer: Self-pay | Source: Ambulatory Visit | Attending: Obstetrics and Gynecology

## 2015-12-27 ENCOUNTER — Ambulatory Visit (HOSPITAL_COMMUNITY)
Admission: RE | Admit: 2015-12-27 | Discharge: 2015-12-28 | Disposition: A | Discharge status: Home / Self Care | Payer: BLUE CROSS/BLUE SHIELD | Source: Ambulatory Visit | Attending: Obstetrics and Gynecology | Admitting: Obstetrics and Gynecology

## 2015-12-27 ENCOUNTER — Ambulatory Visit (HOSPITAL_COMMUNITY): Payer: BLUE CROSS/BLUE SHIELD | Admitting: Anesthesiology

## 2015-12-27 ENCOUNTER — Encounter (HOSPITAL_COMMUNITY): Payer: Self-pay | Admitting: Emergency Medicine

## 2015-12-27 DIAGNOSIS — Z91040 Latex allergy status: Secondary | ICD-10-CM | POA: Insufficient documentation

## 2015-12-27 DIAGNOSIS — N840 Polyp of corpus uteri: Secondary | ICD-10-CM | POA: Diagnosis not present

## 2015-12-27 DIAGNOSIS — D649 Anemia, unspecified: Secondary | ICD-10-CM | POA: Insufficient documentation

## 2015-12-27 DIAGNOSIS — R200 Anesthesia of skin: Secondary | ICD-10-CM | POA: Diagnosis not present

## 2015-12-27 DIAGNOSIS — D259 Leiomyoma of uterus, unspecified: Secondary | ICD-10-CM | POA: Insufficient documentation

## 2015-12-27 DIAGNOSIS — F17210 Nicotine dependence, cigarettes, uncomplicated: Secondary | ICD-10-CM | POA: Insufficient documentation

## 2015-12-27 DIAGNOSIS — F319 Bipolar disorder, unspecified: Secondary | ICD-10-CM | POA: Diagnosis not present

## 2015-12-27 DIAGNOSIS — N83202 Unspecified ovarian cyst, left side: Secondary | ICD-10-CM | POA: Diagnosis not present

## 2015-12-27 DIAGNOSIS — Z9889 Other specified postprocedural states: Secondary | ICD-10-CM

## 2015-12-27 HISTORY — PX: CYSTOSCOPY: SHX5120

## 2015-12-27 HISTORY — PX: ROBOTIC ASSISTED TOTAL HYSTERECTOMY: SHX6085

## 2015-12-27 HISTORY — PX: BILATERAL SALPINGECTOMY: SHX5743

## 2015-12-27 LAB — TYPE AND SCREEN
ABO/RH(D): O POS
Antibody Screen: NEGATIVE

## 2015-12-27 LAB — ABO/RH: ABO/RH(D): O POS

## 2015-12-27 SURGERY — ROBOTIC ASSISTED TOTAL HYSTERECTOMY
Anesthesia: General | Site: Bladder

## 2015-12-27 MED ORDER — KETOROLAC TROMETHAMINE 30 MG/ML IJ SOLN
INTRAMUSCULAR | Status: AC
  Filled 2015-12-27: qty 1

## 2015-12-27 MED ORDER — MIDAZOLAM HCL 2 MG/2ML IJ SOLN
INTRAMUSCULAR | Status: AC
  Filled 2015-12-27: qty 2

## 2015-12-27 MED ORDER — ROPIVACAINE HCL 5 MG/ML IJ SOLN
INTRAMUSCULAR | Status: AC
  Filled 2015-12-27: qty 30

## 2015-12-27 MED ORDER — ONDANSETRON HCL 4 MG PO TABS: 4.0000 mg | ORAL_TABLET | Freq: Four times a day (QID) | ORAL | Status: DC | PRN

## 2015-12-27 MED ORDER — FENTANYL CITRATE (PF) 250 MCG/5ML IJ SOLN
INTRAMUSCULAR | Status: AC
  Filled 2015-12-27: qty 5

## 2015-12-27 MED ORDER — ROCURONIUM BROMIDE 100 MG/10ML IV SOLN
INTRAVENOUS | Status: AC
  Filled 2015-12-27: qty 1

## 2015-12-27 MED ORDER — SODIUM CHLORIDE 0.9 % IV SOLN
INTRAVENOUS | Status: DC | PRN
  Administered 2015-12-27: 60 mL

## 2015-12-27 MED ORDER — SCOPOLAMINE 1 MG/3DAYS TD PT72
1.0000 | MEDICATED_PATCH | Freq: Once | TRANSDERMAL | Status: DC
  Administered 2015-12-27: 1.5 mg via TRANSDERMAL

## 2015-12-27 MED ORDER — LACTATED RINGERS IV SOLN
INTRAVENOUS | Status: DC
  Administered 2015-12-27: 07:00:00 via INTRAVENOUS

## 2015-12-27 MED ORDER — HYDROMORPHONE HCL 1 MG/ML IJ SOLN
INTRAMUSCULAR | Status: AC
  Filled 2015-12-27: qty 1

## 2015-12-27 MED ORDER — OXYCODONE-ACETAMINOPHEN 5-325 MG PO TABS
1.0000 | ORAL_TABLET | ORAL | Status: DC | PRN
  Administered 2015-12-27: 2 via ORAL
  Administered 2015-12-27: 1 via ORAL
  Administered 2015-12-28: 2 via ORAL
  Filled 2015-12-27 (×3): qty 2

## 2015-12-27 MED ORDER — ONDANSETRON HCL 4 MG/2ML IJ SOLN
INTRAMUSCULAR | Status: AC
  Filled 2015-12-27: qty 2

## 2015-12-27 MED ORDER — LIDOCAINE HCL (CARDIAC) 20 MG/ML IV SOLN
INTRAVENOUS | Status: DC | PRN
  Administered 2015-12-27: 80 mg via INTRAVENOUS

## 2015-12-27 MED ORDER — GLYCOPYRROLATE 0.2 MG/ML IJ SOLN
INTRAMUSCULAR | Status: DC | PRN
  Administered 2015-12-27: 0.6 mg via INTRAVENOUS

## 2015-12-27 MED ORDER — KETOROLAC TROMETHAMINE 30 MG/ML IJ SOLN: 30.0000 mg | Freq: Four times a day (QID) | INTRAMUSCULAR | Status: DC

## 2015-12-27 MED ORDER — CEFAZOLIN SODIUM-DEXTROSE 2-3 GM-% IV SOLR
INTRAVENOUS | Status: AC
  Administered 2015-12-27: 2 g via INTRAVENOUS
  Filled 2015-12-27: qty 50

## 2015-12-27 MED ORDER — HYDROMORPHONE HCL 1 MG/ML IJ SOLN
0.2500 mg | INTRAMUSCULAR | Status: DC | PRN
  Administered 2015-12-27 (×4): 0.5 mg via INTRAVENOUS

## 2015-12-27 MED ORDER — IBUPROFEN 800 MG PO TABS: 800.0000 mg | ORAL_TABLET | Freq: Three times a day (TID) | ORAL | Status: DC | PRN

## 2015-12-27 MED ORDER — SIMETHICONE 80 MG PO CHEW
80.0000 mg | CHEWABLE_TABLET | Freq: Four times a day (QID) | ORAL | Status: DC | PRN
  Administered 2015-12-27: 80 mg via ORAL
  Filled 2015-12-27: qty 1

## 2015-12-27 MED ORDER — MENTHOL 3 MG MT LOZG: 1.0000 | LOZENGE | OROMUCOSAL | Status: DC | PRN

## 2015-12-27 MED ORDER — HYDROMORPHONE HCL 1 MG/ML IJ SOLN
INTRAMUSCULAR | Status: DC | PRN
  Administered 2015-12-27 (×2): 0.5 mg via INTRAVENOUS

## 2015-12-27 MED ORDER — SCOPOLAMINE 1 MG/3DAYS TD PT72
MEDICATED_PATCH | TRANSDERMAL | Status: AC
  Administered 2015-12-27: 1.5 mg via TRANSDERMAL
  Filled 2015-12-27: qty 1

## 2015-12-27 MED ORDER — SODIUM CHLORIDE 0.9 % IJ SOLN
INTRAMUSCULAR | Status: AC
  Filled 2015-12-27: qty 50

## 2015-12-27 MED ORDER — ROCURONIUM BROMIDE 100 MG/10ML IV SOLN
INTRAVENOUS | Status: DC | PRN
  Administered 2015-12-27: 50 mg via INTRAVENOUS
  Administered 2015-12-27 (×3): 10 mg via INTRAVENOUS
  Administered 2015-12-27: 30 mg via INTRAVENOUS
  Administered 2015-12-27: 20 mg via INTRAVENOUS

## 2015-12-27 MED ORDER — NEOSTIGMINE METHYLSULFATE 10 MG/10ML IV SOLN
INTRAVENOUS | Status: DC | PRN
  Administered 2015-12-27: 4 mg via INTRAVENOUS

## 2015-12-27 MED ORDER — FENTANYL CITRATE (PF) 100 MCG/2ML IJ SOLN
INTRAMUSCULAR | Status: DC | PRN
  Administered 2015-12-27 (×6): 50 ug via INTRAVENOUS
  Administered 2015-12-27: 100 ug via INTRAVENOUS
  Administered 2015-12-27 (×2): 50 ug via INTRAVENOUS

## 2015-12-27 MED ORDER — LIDOCAINE HCL (CARDIAC) 20 MG/ML IV SOLN
INTRAVENOUS | Status: AC
  Filled 2015-12-27: qty 5

## 2015-12-27 MED ORDER — STERILE WATER FOR IRRIGATION IR SOLN
Status: DC | PRN
  Administered 2015-12-27: 1000 mL

## 2015-12-27 MED ORDER — DEXAMETHASONE SODIUM PHOSPHATE 10 MG/ML IJ SOLN
INTRAMUSCULAR | Status: AC
  Filled 2015-12-27: qty 1

## 2015-12-27 MED ORDER — LACTATED RINGERS IR SOLN
Status: DC | PRN
  Administered 2015-12-27: 1000 mL

## 2015-12-27 MED ORDER — MEPERIDINE HCL 25 MG/ML IJ SOLN: 6.2500 mg | INTRAMUSCULAR | Status: DC | PRN

## 2015-12-27 MED ORDER — ONDANSETRON HCL 4 MG/2ML IJ SOLN: 4.0000 mg | Freq: Four times a day (QID) | INTRAMUSCULAR | Status: DC | PRN

## 2015-12-27 MED ORDER — LACTATED RINGERS IV SOLN
INTRAVENOUS | Status: DC | PRN
  Administered 2015-12-27 (×2): via INTRAVENOUS

## 2015-12-27 MED ORDER — METHYLENE BLUE 1 % INJ SOLN
INTRAMUSCULAR | Status: AC
  Filled 2015-12-27: qty 1

## 2015-12-27 MED ORDER — KETOROLAC TROMETHAMINE 30 MG/ML IJ SOLN
INTRAMUSCULAR | Status: DC | PRN
  Administered 2015-12-27: 30 mg via INTRAVENOUS

## 2015-12-27 MED ORDER — CEFAZOLIN SODIUM-DEXTROSE 2-3 GM-% IV SOLR: 2.0000 g | INTRAVENOUS | Status: DC

## 2015-12-27 MED ORDER — TRAMADOL HCL 50 MG PO TABS
50.0000 mg | ORAL_TABLET | Freq: Four times a day (QID) | ORAL | Status: DC | PRN
Start: 2015-12-27 — End: 2015-12-28
  Administered 2015-12-27 – 2015-12-28 (×3): 50 mg via ORAL
  Filled 2015-12-27 (×3): qty 1

## 2015-12-27 MED ORDER — PROPOFOL 10 MG/ML IV BOLUS
INTRAVENOUS | Status: DC | PRN
  Administered 2015-12-27: 200 mg via INTRAVENOUS

## 2015-12-27 MED ORDER — MIDAZOLAM HCL 2 MG/2ML IJ SOLN
INTRAMUSCULAR | Status: DC | PRN
  Administered 2015-12-27: 2 mg via INTRAVENOUS

## 2015-12-27 MED ORDER — ONDANSETRON HCL 4 MG/2ML IJ SOLN
INTRAMUSCULAR | Status: DC | PRN
  Administered 2015-12-27: 4 mg via INTRAVENOUS

## 2015-12-27 MED ORDER — METOCLOPRAMIDE HCL 5 MG/ML IJ SOLN: 10.0000 mg | Freq: Once | INTRAMUSCULAR | Status: DC | PRN

## 2015-12-27 MED ORDER — PROPOFOL 10 MG/ML IV BOLUS
INTRAVENOUS | Status: AC
  Filled 2015-12-27: qty 40

## 2015-12-27 MED ORDER — DEXAMETHASONE SODIUM PHOSPHATE 10 MG/ML IJ SOLN
INTRAMUSCULAR | Status: DC | PRN
  Administered 2015-12-27: 4 mg via INTRAVENOUS

## 2015-12-27 SURGICAL SUPPLY — 82 items
BARRIER ADHS 3X4 INTERCEED (GAUZE/BANDAGES/DRESSINGS) ×6 IMPLANT
BENZOIN TINCTURE PRP APPL 2/3 (GAUZE/BANDAGES/DRESSINGS) ×6 IMPLANT
CATH SILICONE 16FRX5CC (CATHETERS) ×6 IMPLANT
CHLORAPREP W/TINT 26ML (MISCELLANEOUS) ×6 IMPLANT
CLOSURE WOUND 1/2 X4 (GAUZE/BANDAGES/DRESSINGS) ×1
CLOTH BEACON ORANGE TIMEOUT ST (SAFETY) ×6 IMPLANT
CONT PATH 16OZ SNAP LID 3702 (MISCELLANEOUS) ×6 IMPLANT
COUNTER NEEDLE 1200 MAGNETIC (NEEDLE) ×6 IMPLANT
COVER BACK TABLE 60X90IN (DRAPES) ×12 IMPLANT
COVER TIP SHEARS 8 DVNC (MISCELLANEOUS) ×8 IMPLANT
COVER TIP SHEARS 8MM DA VINCI (MISCELLANEOUS) ×4
DECANTER SPIKE VIAL GLASS SM (MISCELLANEOUS) ×12 IMPLANT
DEFOGGER SCOPE WARMER CLEARIFY (MISCELLANEOUS) ×6 IMPLANT
DRSG COVADERM 4X10 (GAUZE/BANDAGES/DRESSINGS) ×12 IMPLANT
DRSG COVADERM PLUS 2X2 (GAUZE/BANDAGES/DRESSINGS) ×24 IMPLANT
DRSG OPSITE POSTOP 3X4 (GAUZE/BANDAGES/DRESSINGS) ×6 IMPLANT
DRSG TEGADERM 4X4.75 (GAUZE/BANDAGES/DRESSINGS) ×24 IMPLANT
DURAPREP 26ML APPLICATOR (WOUND CARE) ×6 IMPLANT
ELECT REM PT RETURN 9FT ADLT (ELECTROSURGICAL) ×6
ELECTRODE REM PT RTRN 9FT ADLT (ELECTROSURGICAL) ×4 IMPLANT
GAUZE VASELINE 3X9 (GAUZE/BANDAGES/DRESSINGS) ×6 IMPLANT
GLOVE BIO SURGEON STRL SZ 6.5 (GLOVE) IMPLANT
GLOVE BIO SURGEON STRL SZ7 (GLOVE) IMPLANT
GLOVE BIO SURGEONS STRL SZ 6.5 (GLOVE)
GLOVE BIOGEL PI IND STRL 6.5 (GLOVE) ×16 IMPLANT
GLOVE BIOGEL PI IND STRL 7.0 (GLOVE) ×24 IMPLANT
GLOVE BIOGEL PI INDICATOR 6.5 (GLOVE) ×8
GLOVE BIOGEL PI INDICATOR 7.0 (GLOVE) ×12
GLOVE ECLIPSE 6.5 STRL STRAW (GLOVE) IMPLANT
GLOVE SURG SS PI 7.0 STRL IVOR (GLOVE) ×6 IMPLANT
GRASPER BIPOLAR FEN DA VINCI (INSTRUMENTS)
GRASPER BPLR FEN DVNC (INSTRUMENTS) IMPLANT
GYRUS RUMI II 2.5CM BLUE (DISPOSABLE)
GYRUS RUMI II 3.5CM BLUE (DISPOSABLE)
GYRUS RUMI II 4.0CM BLUE (DISPOSABLE)
KIT ACCESSORY DA VINCI DISP (KITS) ×2
KIT ACCESSORY DVNC DISP (KITS) ×4 IMPLANT
LEGGING LITHOTOMY PAIR STRL (DRAPES) ×6 IMPLANT
LIQUID BAND (GAUZE/BANDAGES/DRESSINGS) ×6 IMPLANT
MANIPULATOR UTERINE 4.5 ZUMI (MISCELLANEOUS) IMPLANT
NEEDLE INSUFFLATION 120MM (ENDOMECHANICALS) ×6 IMPLANT
NS IRRIG 1000ML POUR BTL (IV SOLUTION) ×6 IMPLANT
OCCLUDER COLPOPNEUMO (BALLOONS) ×6 IMPLANT
PACK ROBOT WH (CUSTOM PROCEDURE TRAY) ×6 IMPLANT
PACK ROBOTIC GOWN (GOWN DISPOSABLE) ×6 IMPLANT
PAD PREP 24X48 CUFFED NSTRL (MISCELLANEOUS) ×12 IMPLANT
PAD TRENDELENBURG POSITION (MISCELLANEOUS) ×6 IMPLANT
RUMI II 3.0CM BLUE KOH-EFFICIE (DISPOSABLE) ×6 IMPLANT
RUMI II GYRUS 2.5CM BLUE (DISPOSABLE) IMPLANT
RUMI II GYRUS 3.5CM BLUE (DISPOSABLE) IMPLANT
RUMI II GYRUS 4.0CM BLUE (DISPOSABLE) IMPLANT
SET CYSTO W/LG BORE CLAMP LF (SET/KITS/TRAYS/PACK) ×12 IMPLANT
SET IRRIG TUBING LAPAROSCOPIC (IRRIGATION / IRRIGATOR) ×6 IMPLANT
SET TRI-LUMEN FLTR TB AIRSEAL (TUBING) ×6 IMPLANT
STRIP CLOSURE SKIN 1/2X4 (GAUZE/BANDAGES/DRESSINGS) ×5 IMPLANT
SUT DVC VLOC 180 0 12IN GS21 (SUTURE)
SUT VIC AB 0 CT1 27 (SUTURE) ×10
SUT VIC AB 0 CT1 27XBRD ANBCTR (SUTURE) ×20 IMPLANT
SUT VIC AB 2-0 CT2 27 (SUTURE) ×12 IMPLANT
SUT VIC AB 2-0 UR6 27 (SUTURE) ×12 IMPLANT
SUT VICRYL 0 UR6 27IN ABS (SUTURE) ×24 IMPLANT
SUT VICRYL RAPIDE 3 0 (SUTURE) ×12 IMPLANT
SUT VLOC 180 0 9IN  GS21 (SUTURE) ×2
SUT VLOC 180 0 9IN GS21 (SUTURE) ×4 IMPLANT
SUTURE DVC VLC 180 0 12IN GS21 (SUTURE) IMPLANT
SYR 50ML LL SCALE MARK (SYRINGE) ×6 IMPLANT
SYSTEM CONVERTIBLE TROCAR (TROCAR) IMPLANT
TIP RUMI ORANGE 6.7MMX12CM (TIP) ×6 IMPLANT
TIP UTERINE 5.1X6CM LAV DISP (MISCELLANEOUS) IMPLANT
TIP UTERINE 6.7X10CM GRN DISP (MISCELLANEOUS) IMPLANT
TIP UTERINE 6.7X6CM WHT DISP (MISCELLANEOUS) IMPLANT
TIP UTERINE 6.7X8CM BLUE DISP (MISCELLANEOUS) IMPLANT
TOWEL OR 17X24 6PK STRL BLUE (TOWEL DISPOSABLE) ×12 IMPLANT
TRAY FOLEY CATH SILVER 14FR (SET/KITS/TRAYS/PACK) ×6 IMPLANT
TROCAR DILATING TIP 12MM 150MM (ENDOMECHANICALS) ×6 IMPLANT
TROCAR DISP BLADELESS 8 DVNC (TROCAR) ×4 IMPLANT
TROCAR DISP BLADELESS 8MM (TROCAR) ×2
TROCAR OPTI TIP 12M 100M (ENDOMECHANICALS) IMPLANT
TROCAR PORT AIRSEAL 5X120 (TROCAR) IMPLANT
TROCAR XCEL 12X100 BLDLESS (ENDOMECHANICALS) ×6 IMPLANT
TROCAR XCEL NON-BLD 5MMX100MML (ENDOMECHANICALS) ×6 IMPLANT
WATER STERILE IRR 1000ML POUR (IV SOLUTION) ×18 IMPLANT

## 2015-12-27 NOTE — Brief Op Note (Signed)
12/27/2015  11:29 AM  PATIENT:  Samantha Howard  34 y.o. female  PRE-OPERATIVE DIAGNOSIS:  FIBROIDS  POST-OPERATIVE DIAGNOSIS:  FIBROIDS  PROCEDURE:  Procedure(s): ROBOTIC ASSISTED TOTAL HYSTERECTOMY (N/A) BILATERAL SALPINGECTOMY (Bilateral) CYSTOSCOPY (N/A)  SURGEON:  Surgeon(s) and Role:    * Bobbye Charleston, MD - Primary    * Allyn Kenner, DO - Assisting  ANESTHESIA:   general  EBL:  Total I/O In: 2000 [I.V.:2000] Out: 400 [Urine:400]  LOCAL MEDICATIONS USED:  BUPIVICAINE   SPECIMEN:  Source of Specimen:  uterus,cervix, tubes  DISPOSITION OF SPECIMEN:  PATHOLOGY  COUNTS:  YES  TOURNIQUET:  * No tourniquets in log *  DICTATION: .Note written in EPIC  PLAN OF CARE: Admit for overnight observation  PATIENT DISPOSITION:  PACU - hemodynamically stable.   Delay start of Pharmacological VTE agent (>24hrs) due to surgical blood loss or risk of bleeding: not applicable  Complications:  None.  Findings:  18 weeks size uterus.  R ovary was normal; L ovary had a small cyst.  The ureters were identified during multiple points of the case and were always out of the field of dissection.  On cystoscopy, the bladder was intact and bilateral spill was seen from each ureteral orriface.    Medications:  Ancef. Bupivicaine.   Technique:  After adequate anesthesia was achieved the patient was positioned, prepped and draped in usual sterile fashion.  A speculum was placed in the vagina and the cervix dilated with pratt dilators.  The 12 cm Rumi and 3 cm Koh ring were assembled and placed in proper fashion.  The  Speculum was removed and the bladder catheterized with a foley.    Attention was turned to the abdomen where a 1 cm incision was made 3 cm above the umbilicus.  The skin was incised with the scalpel and each layer then incised in turn until entry into the peritoneum.  The hassan trocar was introduced after the angles were secured with vicryl and the abdomen  insufflated. The long 12 mm trocar was placed and the other three trocar sites were marked out, all approximately 10 cm from each other and the camera.  Two 8.5 mm trocars were placed on either side of the camera port and a 5 mm assistant port was placed 3 cm above the L iliac crest.  All trocars were inserted under direct visualization of the camera.  The patient was placed in trendelenburg and then the Robot docked.  The PK forceps were placed on arm 2 and the Hot shears on arm 1 and introduced under direct visualization of the camera.  I then broke scrub and sat down at the console.  The above findings were noted and the ureters identified well out of the field of dissection.  The right fallopian tube was isolated and cauterized with the PK.  The Utero-ovarian ligament was then divided with the PK cautery and shears.  The posterior broad ligament was then divided with the hot shears until the uterosacral ligament.  The Broad and cardinal ligaments were then cauterized against the cervix to the level of the Koh ring, securing the uterine artery.  Each pedicle was then incised with the shears.  The anterior leaf was then incised at the reflection of the vessico-uterine junction and the lateral bladder retracted inferiorly after the round ligament had been divided with the PK forceps.  The left tube was cauterized with the PK and divided with the shears;  then the left utero-ovarian ligament divided with the  PK forceps and the scissors.  The round ligament was divided as well and the posterior leaf of the broad ligament then divided with the hot shears. The broad and cardinal ligaments were then cauterized on the left in the same way.   At the level of the internal os, the uterine arteries were bilaterally cauterized with the PK.  The ureters were identified well out of the field of dissection.  .   The bladder was then able to be retracted inferiorly and the vesico-uterine fascia was incised in the midline  until the bladder was removed one cm below the Koh ring.  The hot shears were then used to open the myometrium and the fibroids were excised from the uterus.  The hot shears then circumferentially incised the vagina at the level of the reflection on the [facility] ring.  Once the uterus and cervix were amputated, cautery was used to insure hemostasis of the cuff.  The uterus followed by two fibroids were then removed through the vagina.  The gas was reduced and hemostasis was checked-once hemostasis was achieved with some cautery on the pedicles, the instrument on Arm 1 was changed to the mega suture cut needle driver and the cuff was closed with a running stitches of 0-vicryl V loc.  Cautery was used to ensure hemostasis of the left pedicles very superficially after a 2-0 vicryl suture was attempted to help hemostasis.  The ureters were peristalsing bilaterally well and very lateral to the areas of operation.  The needle was removed and Bupivicaine was introduced to the pelvis.  All instruments were then removed from the abdomen.    The Robot was then undocked and I scrubbed back in.    The 12 mm trocar site was closed with running lock stitch of 0 vicryl. The skin incisions were closed with subcuticular stitches of 3-0 vicryl Rapide and Dermabond.  All instruments were removed from the vagina and cystoscopy performed, revealing an intact bladder and vigourous spill of indigo carmine from each ureteral orifice.  The cystoscope was removed and the patient taken to the recovery room in stable condition.  Hosteen Kienast A

## 2015-12-27 NOTE — Anesthesia Postprocedure Evaluation (Signed)
Anesthesia Post Note  Patient: Samantha Howard  Procedure(s) Performed: Procedure(s) (LRB): ROBOTIC ASSISTED TOTAL HYSTERECTOMY (N/A) BILATERAL SALPINGECTOMY (Bilateral) CYSTOSCOPY (N/A)  Patient location during evaluation: PACU Anesthesia Type: General Level of consciousness: awake and alert and oriented Pain management: pain level controlled Vital Signs Assessment: post-procedure vital signs reviewed and stable Respiratory status: spontaneous breathing, nonlabored ventilation, respiratory function stable and patient connected to nasal cannula oxygen Cardiovascular status: blood pressure returned to baseline and stable Postop Assessment: no signs of nausea or vomiting Anesthetic complications: no    Last Vitals:  Filed Vitals:   12/27/15 1215 12/27/15 1230  BP: 122/69 122/71  Pulse: 80 89  Temp:    Resp: 10 16    Last Pain:  Filed Vitals:   12/27/15 1243  PainSc: 5                  Lema Heinkel A.

## 2015-12-27 NOTE — Anesthesia Procedure Notes (Signed)
Procedure Name: Intubation Date/Time: 12/27/2015 7:43 AM Performed by: Bobbye Charleston Pre-anesthesia Checklist: Patient identified, Emergency Drugs available, Suction available, Patient being monitored and Timeout performed Patient Re-evaluated:Patient Re-evaluated prior to inductionOxygen Delivery Method: Circle system utilized Preoxygenation: Pre-oxygenation with 100% oxygen Intubation Type: IV induction Ventilation: Mask ventilation without difficulty Laryngoscope Size: Miller and 2 Grade View: Grade I Tube type: Oral Tube size: 7.0 mm Number of attempts: 1 Airway Equipment and Method: Stylet Placement Confirmation: ETT inserted through vocal cords under direct vision,  positive ETCO2 and breath sounds checked- equal and bilateral Secured at: 22 cm Tube secured with: Tape Dental Injury: Teeth and Oropharynx as per pre-operative assessment  Future Recommendations: Recommend- induction with short-acting agent, and alternative techniques readily available

## 2015-12-27 NOTE — Op Note (Addendum)
12/27/2015  11:29 AM  PATIENT:  Samantha Howard  34 y.o. female  PRE-OPERATIVE DIAGNOSIS:  FIBROIDS  POST-OPERATIVE DIAGNOSIS:  FIBROIDS  PROCEDURE:  Procedure(s): ROBOTIC ASSISTED TOTAL HYSTERECTOMY (N/A) BILATERAL SALPINGECTOMY (Bilateral) CYSTOSCOPY (N/A)  SURGEON:  Surgeon(s) and Role:    * Bobbye Charleston, MD - Primary    * Allyn Kenner, DO - Assisting  ANESTHESIA:   general  EBL:  Total I/O In: 2000 [I.V.:2000] Out: 400 [Urine:400]  LOCAL MEDICATIONS USED:  BUPIVICAINE   SPECIMEN:  Source of Specimen:  uterus,cervix, tubes  DISPOSITION OF SPECIMEN:  PATHOLOGY  COUNTS:  YES  TOURNIQUET:  * No tourniquets in log *  DICTATION: .Note written in EPIC  PLAN OF CARE: Admit for overnight observation  PATIENT DISPOSITION:  PACU - hemodynamically stable.   Delay start of Pharmacological VTE agent (>24hrs) due to surgical blood loss or risk of bleeding: not applicable  Complications:  None.  Findings:  18 weeks size uterus.  R ovary was normal; L ovary had a small cyst.  The ureters were identified during multiple points of the case and were always out of the field of dissection.  On cystoscopy, the bladder was intact and bilateral spill was seen from each ureteral orriface.    Medications:  Ancef. Bupivicaine.   Technique:  After adequate anesthesia was achieved the patient was positioned, prepped and draped in usual sterile fashion.  A speculum was placed in the vagina and the cervix dilated with pratt dilators.  The 12 cm Rumi and 3 cm Koh ring were assembled and placed in proper fashion.  The  Speculum was removed and the bladder catheterized with a foley.    Attention was turned to the abdomen where a 1 cm incision was made 3 cm above the umbilicus.  The skin was incised with the scalpel and each layer then incised in turn until entry into the peritoneum.  The hassan trocar was introduced after the angles were secured with vicryl and the abdomen  insufflated. The long 12 mm trocar was placed and the other three trocar sites were marked out, all approximately 10 cm from each other and the camera.  Two 8.5 mm trocars were placed on either side of the camera port and a 5 mm assistant port was placed 3 cm above the L iliac crest.  All trocars were inserted under direct visualization of the camera.  The patient was placed in trendelenburg and then the Robot docked.  The PK forceps were placed on arm 2 and the Hot shears on arm 1 and introduced under direct visualization of the camera.  I then broke scrub and sat down at the console.  The above findings were noted and the ureters identified well out of the field of dissection.  The right fallopian tube was isolated and cauterized with the PK.  The Utero-ovarian ligament was then divided with the PK cautery and shears.  The posterior broad ligament was then divided with the hot shears until the uterosacral ligament.  The Broad and cardinal ligaments were then cauterized against the cervix to the level of the Koh ring, securing the uterine artery.  Each pedicle was then incised with the shears.  The anterior leaf was then incised at the reflection of the vessico-uterine junction and the lateral bladder retracted inferiorly after the round ligament had been divided with the PK forceps.  The left tube was cauterized with the PK and divided with the shears;  then the left utero-ovarian ligament divided with the  PK forceps and the scissors.  The round ligament was divided as well and the posterior leaf of the broad ligament then divided with the hot shears. The broad and cardinal ligaments were then cauterized on the left in the same way.   At the level of the internal os, the uterine arteries were bilaterally cauterized with the PK.  The ureters were identified well out of the field of dissection.  .   The bladder was then able to be retracted inferiorly and the vesico-uterine fascia was incised in the midline  until the bladder was removed one cm below the Koh ring.  The hot shears were then used to open the myometrium and the fibroids were excised from the uterus.  The hot shears then circumferentially incised the vagina at the level of the reflection on the [facility] ring.  Once the uterus and cervix were amputated, cautery was used to insure hemostasis of the cuff.  The uterus followed by two fibroids were then removed through the vagina.  The gas was reduced and hemostasis was checked-once hemostasis was achieved with some cautery on the pedicles, the instrument on Arm 1 was changed to the mega suture cut needle driver and the cuff was closed with a running stitches of 0-vicryl V loc.  Cautery was used to ensure hemostasis of the left pedicles very superficially after a 2-0 vicryl suture was attempted to help hemostasis.  The ureters were peristalsing bilaterally well and very lateral to the areas of operation.  The needle was removed and Bupivicaine was introduced to the pelvis.  All instruments were then removed from the abdomen.    The Robot was then undocked and I scrubbed back in.    The 12 mm trocar site was closed with running lock stitch of 0 vicryl. The skin incisions were closed with subcuticular stitches of 3-0 vicryl Rapide and Dermabond.  All instruments were removed from the vagina and cystoscopy performed, revealing an intact bladder and vigourous spill of indigo carmine from each ureteral orifice.  The cystoscope was removed and the patient taken to the recovery room in stable condition.  Total weight of uterus was 675 gm.    Deriana Vanderhoef A

## 2015-12-27 NOTE — Progress Notes (Signed)
There has been no change in the patients history, status or exam since the history and physical.  Filed Vitals:   12/27/15 0610  BP: 134/84  Pulse: 96  Temp: 98 F (36.7 C)  TempSrc: Oral  SpO2: 100%    Lab Results  Component Value Date   WBC 6.5 12/21/2015   HGB 8.4* 12/21/2015   HCT 28.9* 12/21/2015   MCV 70.5* 12/21/2015   PLT 344 12/21/2015    Samantha Howard A

## 2015-12-27 NOTE — Transfer of Care (Signed)
Immediate Anesthesia Transfer of Care Note  Patient: Samantha Howard  Procedure(s) Performed: Procedure(s): ROBOTIC ASSISTED TOTAL HYSTERECTOMY (N/A) BILATERAL SALPINGECTOMY (Bilateral) CYSTOSCOPY (N/A)  Patient Location: PACU  Anesthesia Type:General  Level of Consciousness: awake, alert  and oriented  Airway & Oxygen Therapy: Patient Spontanous Breathing and Patient connected to nasal cannula oxygen  Post-op Assessment: Report given to RN and Post -op Vital signs reviewed and stable  Post vital signs: Reviewed and stable  Last Vitals:  Filed Vitals:   12/27/15 0610  BP: 134/84  Pulse: 96  Temp: 123XX123 C    Complications: No apparent anesthesia complications

## 2015-12-27 NOTE — Progress Notes (Signed)
Assumed care from [place].

## 2015-12-28 ENCOUNTER — Encounter (HOSPITAL_COMMUNITY): Payer: Self-pay | Admitting: Obstetrics and Gynecology

## 2015-12-28 DIAGNOSIS — D259 Leiomyoma of uterus, unspecified: Secondary | ICD-10-CM | POA: Diagnosis not present

## 2015-12-28 LAB — COMPREHENSIVE METABOLIC PANEL
ALBUMIN: 3.3 g/dL — AB (ref 3.5–5.0)
ALT: 8 U/L — ABNORMAL LOW (ref 14–54)
ANION GAP: 7 (ref 5–15)
AST: 21 U/L (ref 15–41)
Alkaline Phosphatase: 63 U/L (ref 38–126)
BILIRUBIN TOTAL: 0.5 mg/dL (ref 0.3–1.2)
BUN: 8 mg/dL (ref 6–20)
CHLORIDE: 104 mmol/L (ref 101–111)
CO2: 25 mmol/L (ref 22–32)
Calcium: 8.4 mg/dL — ABNORMAL LOW (ref 8.9–10.3)
Creatinine, Ser: 0.64 mg/dL (ref 0.44–1.00)
GFR calc Af Amer: >60 mL/min (ref >60)
GFR calc non Af Amer: >60 mL/min (ref >60)
GLUCOSE: 99 mg/dL (ref 65–99)
POTASSIUM: 3.3 mmol/L — AB (ref 3.5–5.1)
SODIUM: 136 mmol/L (ref 135–145)
TOTAL PROTEIN: 6.4 g/dL — AB (ref 6.5–8.1)

## 2015-12-28 LAB — CBC
HEMATOCRIT: 24.8 % — AB (ref 36.0–46.0)
HEMOGLOBIN: 7.3 g/dL — AB (ref 12.0–15.0)
MCH: 20.6 pg — ABNORMAL LOW (ref 26.0–34.0)
MCHC: 29.4 g/dL — AB (ref 30.0–36.0)
MCV: 70.1 fL — ABNORMAL LOW (ref 78.0–100.0)
Platelets: 261 10*3/uL (ref 150–400)
RBC: 3.54 MIL/uL — ABNORMAL LOW (ref 3.87–5.11)
RDW: 18 % — AB (ref 11.5–15.5)
WBC: 9.4 10*3/uL (ref 4.0–10.5)

## 2015-12-28 MED ORDER — DIPHENHYDRAMINE HCL 25 MG PO CAPS
25.0000 mg | ORAL_CAPSULE | Freq: Four times a day (QID) | ORAL | Status: DC | PRN
  Administered 2015-12-28: 25 mg via ORAL

## 2015-12-28 MED ORDER — TRAMADOL HCL 50 MG PO TABS: 50.0000 mg | ORAL_TABLET | Freq: Four times a day (QID) | ORAL | Status: DC | PRN

## 2015-12-28 MED ORDER — IBUPROFEN 800 MG PO TABS: 800.0000 mg | ORAL_TABLET | Freq: Three times a day (TID) | ORAL | Status: DC | PRN

## 2015-12-28 NOTE — Progress Notes (Signed)
Patient is eating, ambulating, and voiding.  Pain control is good.  BP 121/71 mmHg  Pulse 90  Temp(Src) 98.9 F (37.2 C) (Oral)  Resp 18  Ht 5\' 6"  (1.676 m)  Wt 77.111 kg (170 lb)  BMI 27.45 kg/m2  SpO2 99%  LMP 12/27/2015  lungs:   clear to auscultation cor:    RRR Abdomen:  soft, appropriate tenderness, incisions intact and without erythema or exudate. ex:    no cords   Lab Results  Component Value Date   WBC 9.4 12/28/2015   HGB 7.3* 12/28/2015   HCT 24.8* 12/28/2015   MCV 70.1* 12/28/2015   PLT 261 12/28/2015    A/P  Routine care.  Expect d/c per plan.  Ultram for pain.

## 2015-12-28 NOTE — Anesthesia Postprocedure Evaluation (Signed)
Anesthesia Post Note  Patient: Jazly Empey  Procedure(s) Performed: Procedure(s) (LRB): ROBOTIC ASSISTED TOTAL HYSTERECTOMY (N/A) BILATERAL SALPINGECTOMY (Bilateral) CYSTOSCOPY (N/A)  Patient location during evaluation: [facility] Anesthesia Type: General Level of consciousness: awake and alert and oriented Pain management: pain level controlled Vital Signs Assessment: post-procedure vital signs reviewed and stable Respiratory status: spontaneous breathing and respiratory function stable Cardiovascular status: blood pressure returned to baseline Postop Assessment: no signs of nausea or vomiting and adequate PO intake Anesthetic complications: no    Last Vitals:  Filed Vitals:   12/28/15 0140 12/28/15 0521  BP: 124/78 121/71  Pulse: 88 90  Temp: 36.9 C 37.2 C  Resp: 18 18    Last Pain:  Filed Vitals:   12/28/15 0655  PainSc: Asleep                 Kendan Cornforth

## 2015-12-28 NOTE — Progress Notes (Signed)
Pt is discharged in the care of husband with N,T. Escort. Denies pain any discomfort.Marland Kitchen Spirits are good. Abdominal incisions are clean and dry  Discharged instructions were given to pt with good understanding. Questions were asked and answered.Stable.

## 2015-12-28 NOTE — Addendum Note (Signed)
Addendum  created 12/28/15 0726 by Riki Sheer, CRNA   Modules edited: Clinical Notes   Clinical Notes:  File: [id number]

## 2015-12-28 NOTE — Discharge Summary (Signed)
Physician Discharge Summary  Patient ID: Samantha Howard MRN: [id number] DOB/AGE: 1981/01/25 34 y.o.  Admit date: 12/27/2015 Discharge date: 12/28/2015  Admission Diagnoses:symptomatic fibroid uterus  Discharge Diagnoses: same Active Problems:   Postoperative state   Discharged Condition: good  Hospital Course: uncomplicated robotic TLH/salpingectomies/cysto  Consults: None  Significant Diagnostic Studies: labs:  Results for orders placed or performed during the hospital encounter of 12/27/15 (from the past 24 hour(s))  CBC     Status: Abnormal   Collection Time: 12/28/15  5:10 AM  Result Value Ref Range   WBC 9.4 4.0 - 10.5 K/uL   RBC 3.54 (L) 3.87 - 5.11 MIL/uL   Hemoglobin 7.3 (L) 12.0 - 15.0 g/dL   HCT 24.8 (L) 36.0 - 46.0 %   MCV 70.1 (L) 78.0 - 100.0 fL   MCH 20.6 (L) 26.0 - 34.0 pg   MCHC 29.4 (L) 30.0 - 36.0 g/dL   RDW 18.0 (H) 11.5 - 15.5 %   Platelets 261 150 - 400 K/uL  Comprehensive metabolic panel     Status: Abnormal   Collection Time: 12/28/15  5:10 AM  Result Value Ref Range   Sodium 136 135 - 145 mmol/L   Potassium 3.3 (L) 3.5 - 5.1 mmol/L   Chloride 104 101 - 111 mmol/L   CO2 25 22 - 32 mmol/L   Glucose, Bld 99 65 - 99 mg/dL   BUN 8 6 - 20 mg/dL   Creatinine, Ser 0.64 0.44 - 1.00 mg/dL   Calcium 8.4 (L) 8.9 - 10.3 mg/dL   Total Protein 6.4 (L) 6.5 - 8.1 g/dL   Albumin 3.3 (L) 3.5 - 5.0 g/dL   AST 21 15 - 41 U/L   ALT 8 (L) 14 - 54 U/L   Alkaline Phosphatase 63 38 - 126 U/L   Total Bilirubin 0.5 0.3 - 1.2 mg/dL   GFR calc non Af Amer >60 >60 mL/min   GFR calc Af Amer >60 >60 mL/min   Anion gap 7 5 - 15    Treatments: surgery: uncomplicated robotic TLH/salpingectomies/cysto  Discharge Exam: Blood pressure 121/71, pulse 90, temperature 98.9 F (37.2 C), temperature source Oral, resp. rate 18, height 5\' 6"  (1.676 m), weight 77.111 kg (170 lb), last menstrual period 12/27/2015, SpO2 99 %.   Disposition: 01-Home or Self Care  Discharge  Instructions    Call MD for:  temperature >100.4    Complete by:  As directed      Diet - low sodium heart healthy    Complete by:  As directed      Discharge instructions    Complete by:  As directed   No driving on narcotics, no sexual activity for 2 weeks.     Increase activity slowly    Complete by:  As directed      May shower / Bathe    Complete by:  As directed   Shower, no bath for 2 weeks.     Remove dressing in 24 hours    Complete by:  As directed      Sexual Activity Restrictions    Complete by:  As directed   No sexual activity for 2 weeks.            Medication List    STOP taking these medications        HYDROcodone-acetaminophen 5-325 MG tablet  Commonly known as:  NORCO/VICODIN     naproxen 375 MG tablet  Commonly known as:  NAPROSYN  TAKE these medications        cyclobenzaprine 5 MG tablet  Commonly known as:  FLEXERIL  Take 1 tablet (5 mg total) by mouth 2 (two) times daily as needed for muscle spasms.     EPINEPHrine 0.3 mg/0.3 mL Soaj injection  Commonly known as:  EPI-PEN  Inject 0.3 mg into the muscle once as needed (severe allergic reaction).     ferrous sulfate 325 (65 FE) MG tablet  Take 1 tablet (325 mg total) by mouth 2 (two) times daily with a meal.     ibuprofen 800 MG tablet  Commonly known as:  ADVIL,MOTRIN  Take 1 tablet (800 mg total) by mouth every 8 (eight) hours as needed (mild pain).     traMADol 50 MG tablet  Commonly known as:  ULTRAM  Take 1 tablet (50 mg total) by mouth every 6 (six) hours as needed (pain if pt does not desire to take percocet).           Follow-up Information    Follow up with Rasheida Broden A, MD In 2 weeks.   Specialty:  Obstetrics and Gynecology   Contact information:   [address] [place] [address] [phone]       Signed: Khiana Camino A 12/28/2015, 7:40 AM

## 2015-12-31 DIAGNOSIS — K921 Melena: Secondary | ICD-10-CM

## 2015-12-31 HISTORY — DX: Melena: K92.1

## 2016-01-19 ENCOUNTER — Encounter (HOSPITAL_COMMUNITY): Payer: Self-pay | Admitting: Emergency Medicine

## 2016-01-19 ENCOUNTER — Emergency Department (HOSPITAL_COMMUNITY)
Admission: EM | Admit: 2016-01-19 | Discharge: 2016-01-19 | Disposition: A | Discharge status: Home / Self Care | Payer: BLUE CROSS/BLUE SHIELD | Attending: Emergency Medicine | Admitting: Emergency Medicine

## 2016-01-19 DIAGNOSIS — D649 Anemia, unspecified: Secondary | ICD-10-CM | POA: Diagnosis not present

## 2016-01-19 DIAGNOSIS — F3163 Bipolar disorder, current episode mixed, severe, without psychotic features: Secondary | ICD-10-CM | POA: Diagnosis present

## 2016-01-19 DIAGNOSIS — T404X2A Poisoning by other synthetic narcotics, intentional self-harm, initial encounter: Secondary | ICD-10-CM | POA: Diagnosis present

## 2016-01-19 DIAGNOSIS — F319 Bipolar disorder, unspecified: Secondary | ICD-10-CM | POA: Insufficient documentation

## 2016-01-19 DIAGNOSIS — Y9389 Activity, other specified: Secondary | ICD-10-CM | POA: Insufficient documentation

## 2016-01-19 DIAGNOSIS — F17210 Nicotine dependence, cigarettes, uncomplicated: Secondary | ICD-10-CM | POA: Insufficient documentation

## 2016-01-19 DIAGNOSIS — Y9289 Other specified places as the place of occurrence of the external cause: Secondary | ICD-10-CM | POA: Diagnosis not present

## 2016-01-19 DIAGNOSIS — R45851 Suicidal ideations: Secondary | ICD-10-CM

## 2016-01-19 DIAGNOSIS — F332 Major depressive disorder, recurrent severe without psychotic features: Secondary | ICD-10-CM | POA: Diagnosis present

## 2016-01-19 DIAGNOSIS — F1010 Alcohol abuse, uncomplicated: Secondary | ICD-10-CM | POA: Diagnosis not present

## 2016-01-19 DIAGNOSIS — T50902A Poisoning by unspecified drugs, medicaments and biological substances, intentional self-harm, initial encounter: Secondary | ICD-10-CM

## 2016-01-19 DIAGNOSIS — Z91040 Latex allergy status: Secondary | ICD-10-CM | POA: Insufficient documentation

## 2016-01-19 DIAGNOSIS — Y998 Other external cause status: Secondary | ICD-10-CM | POA: Insufficient documentation

## 2016-01-19 HISTORY — DX: Other specified health status: Z78.9

## 2016-01-19 HISTORY — DX: Alcohol use, unspecified, uncomplicated: F10.90

## 2016-01-19 HISTORY — DX: Other problems related to lifestyle: Z72.89

## 2016-01-19 LAB — CBC WITH DIFFERENTIAL/PLATELET
BASOS PCT: 1 %
Basophils Absolute: 0.1 10*3/uL (ref 0.0–0.1)
EOS ABS: 0.9 10*3/uL — AB (ref 0.0–0.7)
EOS PCT: 8 %
HCT: 34.2 % — ABNORMAL LOW (ref 36.0–46.0)
HEMOGLOBIN: 10.4 g/dL — AB (ref 12.0–15.0)
Lymphocytes Relative: 33 %
Lymphs Abs: 3.9 10*3/uL (ref 0.7–4.0)
MCH: 21.1 pg — AB (ref 26.0–34.0)
MCHC: 30.4 g/dL (ref 30.0–36.0)
MCV: 69.4 fL — AB (ref 78.0–100.0)
MONO ABS: 0.8 10*3/uL (ref 0.1–1.0)
Monocytes Relative: 7 %
NEUTROS ABS: 6 10*3/uL (ref 1.7–7.7)
NEUTROS PCT: 51 %
PLATELETS: 570 10*3/uL — AB (ref 150–400)
RBC: 4.93 MIL/uL (ref 3.87–5.11)
RDW: 18.1 % — ABNORMAL HIGH (ref 11.5–15.5)
WBC: 11.7 10*3/uL — ABNORMAL HIGH (ref 4.0–10.5)

## 2016-01-19 LAB — COMPREHENSIVE METABOLIC PANEL
ALBUMIN: 4.4 g/dL (ref 3.5–5.0)
ALK PHOS: 93 U/L (ref 38–126)
ALT: 12 U/L — AB (ref 14–54)
AST: 17 U/L (ref 15–41)
Anion gap: 13 (ref 5–15)
BILIRUBIN TOTAL: 0.5 mg/dL (ref 0.3–1.2)
BUN: 9 mg/dL (ref 6–20)
CO2: 18 mmol/L — ABNORMAL LOW (ref 22–32)
CREATININE: 0.59 mg/dL (ref 0.44–1.00)
Calcium: 8.7 mg/dL — ABNORMAL LOW (ref 8.9–10.3)
Chloride: 104 mmol/L (ref 101–111)
GFR calc Af Amer: >60 mL/min (ref >60)
GLUCOSE: 112 mg/dL — AB (ref 65–99)
POTASSIUM: 3.6 mmol/L (ref 3.5–5.1)
Sodium: 135 mmol/L (ref 135–145)
TOTAL PROTEIN: 8.4 g/dL — AB (ref 6.5–8.1)

## 2016-01-19 LAB — RAPID URINE DRUG SCREEN, HOSP PERFORMED
AMPHETAMINES: NOT DETECTED
Barbiturates: NOT DETECTED
Benzodiazepines: NOT DETECTED
Cocaine: NOT DETECTED
Opiates: NOT DETECTED
TETRAHYDROCANNABINOL: NOT DETECTED

## 2016-01-19 LAB — ETHANOL: ALCOHOL ETHYL (B): 312 mg/dL — AB (ref <5)

## 2016-01-19 LAB — SALICYLATE LEVEL: Salicylate Lvl: <4 mg/dL (ref 2.8–30.0)

## 2016-01-19 LAB — ACETAMINOPHEN LEVEL: Acetaminophen (Tylenol), Serum: <10 ug/mL — ABNORMAL LOW (ref 10–30)

## 2016-01-19 MED ORDER — THIAMINE HCL 100 MG/ML IJ SOLN: 100.0000 mg | Freq: Every day | INTRAMUSCULAR | Status: DC

## 2016-01-19 MED ORDER — ONDANSETRON HCL 4 MG PO TABS: 4.0000 mg | ORAL_TABLET | Freq: Three times a day (TID) | ORAL | Status: DC | PRN

## 2016-01-19 MED ORDER — SODIUM CHLORIDE 0.9 % IV BOLUS (SEPSIS)
1000.0000 mL | Freq: Once | INTRAVENOUS | Status: AC
  Administered 2016-01-19: 1000 mL via INTRAVENOUS

## 2016-01-19 MED ORDER — VITAMIN B-1 100 MG PO TABS
100.0000 mg | ORAL_TABLET | Freq: Every day | ORAL | Status: DC
  Administered 2016-01-19: 100 mg via ORAL
  Filled 2016-01-19: qty 1

## 2016-01-19 MED ORDER — LORAZEPAM 1 MG PO TABS
0.0000 mg | ORAL_TABLET | Freq: Four times a day (QID) | ORAL | Status: DC
  Administered 2016-01-19: 1 mg via ORAL
  Filled 2016-01-19: qty 1

## 2016-01-19 MED ORDER — NICOTINE 21 MG/24HR TD PT24: 21.0000 mg | MEDICATED_PATCH | Freq: Every day | TRANSDERMAL | Status: DC | PRN

## 2016-01-19 MED ORDER — LORAZEPAM 1 MG PO TABS
1.0000 mg | ORAL_TABLET | Freq: Once | ORAL | Status: AC
  Administered 2016-01-19: 1 mg via ORAL
  Filled 2016-01-19: qty 1

## 2016-01-19 MED ORDER — LORAZEPAM 2 MG/ML IJ SOLN: 0.0000 mg | Freq: Two times a day (BID) | INTRAMUSCULAR | Status: DC

## 2016-01-19 MED ORDER — LORAZEPAM 1 MG PO TABS: 0.0000 mg | ORAL_TABLET | Freq: Two times a day (BID) | ORAL | Status: DC

## 2016-01-19 MED ORDER — LORAZEPAM 2 MG/ML IJ SOLN: 0.0000 mg | Freq: Four times a day (QID) | INTRAMUSCULAR | Status: DC

## 2016-01-19 MED ORDER — ALUM & MAG HYDROXIDE-SIMETH 200-200-20 MG/5ML PO SUSP: 30.0000 mL | ORAL | Status: DC | PRN

## 2016-01-19 NOTE — ED Notes (Signed)
Pt states she is currently having thoughts of self harm and can not contract for safety at this time. Pt states she would still cut herself now if she could

## 2016-01-19 NOTE — ED Notes (Signed)
Pt care assumed.  Pt is resting comfortably, A&Ox 4.  Reports R shoulder pain which started after her surgery in December.  Denies new injury to her R shoulder.  Pt reports she has pending appt with her orthopedic MD.  Sitter at bedside.

## 2016-01-19 NOTE — ED Notes (Signed)
Patient is adamant that she is not suicidal and has no thoughts of self-harm.  She states comments were made while intoxicated.  Wife is at bedside and is a source of support.  Patient is requesting d/c with resources and both desire to speak with attending.  Speech is clear and logical.  No s/s of withdrawal.

## 2016-01-19 NOTE — ED Provider Notes (Signed)
CSN: [id number]     Arrival date & time 01/19/16  0144 History   First MD Initiated Contact with Patient 01/19/16 0147     No chief complaint on file.     HPI  Pt was seen at [place]. Per Police and pt report: Pt brought in by Police after calling [id number] with SI and "plan to OD on pills." Pt states she got in an argument with her significant other last evening. States she "was drinking" and "took some tramadol" with the intent of harming her self. States she "needs to talk to someone." Denies HI, no hallucinations.    Past Medical History  Diagnosis Date  . Bipolar 1 disorder ([place])   . Seizures ([place])     Pt reports none in the last 10 years  . Depression   . Anemia   . Alcohol use [facility])    Past Surgical History  Procedure Laterality Date  . No past surgeries    . Robotic assisted total hysterectomy N/A 12/27/2015    Procedure: ROBOTIC ASSISTED TOTAL HYSTERECTOMY;  Surgeon: Bobbye Charleston, MD;  Location: [facility];  Service: Gynecology;  Laterality: N/A;  . Bilateral salpingectomy Bilateral 12/27/2015    Procedure: BILATERAL SALPINGECTOMY;  Surgeon: Bobbye Charleston, MD;  Location: [facility];  Service: Gynecology;  Laterality: Bilateral;  . Cystoscopy N/A 12/27/2015    Procedure: CYSTOSCOPY;  Surgeon: Bobbye Charleston, MD;  Location: [facility];  Service: Gynecology;  Laterality: N/A;    Social History  Substance Use Topics  . Smoking status: Current Every Day Smoker -- 1.00 packs/day    Types: Cigars  . Smokeless tobacco: Never Used  . Alcohol Use: 3.6 oz/week    6 Cans of beer per week     Comment: socially   OB History    Gravida Para Term Preterm AB TAB SAB Ectopic Multiple Living   0              Review of Systems ROS: Statement: All systems negative except as marked or noted in the HPI; Constitutional: Negative for fever and chills. ; ; Eyes: Negative for eye pain, redness and discharge. ; ; ENMT: Negative for ear pain, hoarseness, nasal congestion, sinus pressure and sore  throat. ; ; Cardiovascular: Negative for chest pain, palpitations, diaphoresis, dyspnea and peripheral edema. ; ; Respiratory: Negative for cough, wheezing and stridor. ; ; Gastrointestinal: Negative for nausea, vomiting, diarrhea, abdominal pain, blood in stool, hematemesis, jaundice and rectal bleeding. . ; ; Genitourinary: Negative for dysuria, flank pain and hematuria. ; ; Musculoskeletal: Negative for back pain and neck pain. Negative for swelling and trauma.; ; Skin: Negative for pruritus, rash, abrasions, blisters, bruising and skin lesion.; ; Neuro: Negative for headache, lightheadedness and neck stiffness. Negative for weakness, altered level of consciousness , altered mental status, extremity weakness, paresthesias, involuntary movement, seizure and syncope.; Psych:  +SI, +SA. No HI, no hallucinations.      Allergies  Latex  Home Medications   Prior to Admission medications   Medication Sig Start Date End Date Taking? Authorizing Provider  EPINEPHrine (EPI-PEN) 0.3 mg/0.3 mL SOAJ injection Inject 0.3 mg into the muscle once as needed (severe allergic reaction).   Yes Historical Provider, MD  ibuprofen (ADVIL,MOTRIN) 800 MG tablet Take 1 tablet (800 mg total) by mouth every 8 (eight) hours as needed (mild pain). 12/28/15  Yes Bobbye Charleston, MD  traMADol (ULTRAM) 50 MG tablet Take 1 tablet (50 mg total) by mouth every 6 (six) hours as needed (pain if  pt does not desire to take percocet). 12/28/15  Yes Bobbye Charleston, MD  cyclobenzaprine (FLEXERIL) 5 MG tablet Take 1 tablet (5 mg total) by mouth 2 (two) times daily as needed for muscle spasms. Patient not taking: Reported on 12/14/2015 06/01/15   Delos Haring, PA-C  ferrous sulfate 325 (65 FE) MG tablet Take 1 tablet (325 mg total) by mouth 2 (two) times daily with a meal. Patient not taking: Reported on 12/14/2015 [id number]   Delora Fuel, MD   BP [id number] mmHg  Pulse 81  Temp(Src) 98.2 F (36.8 C) (Oral)  Resp 16  SpO2 100%  LMP  11/14/2015 (Approximate)   Filed Vitals:   01/19/16 0152 01/19/16 0247 01/19/16 0319  BP: 146/101 129/94 129/94  Pulse: 142 118 112  Temp: 98.2 F (36.8 C)    TempSrc: Oral    Resp: 16  16  SpO2: 98%  100%   Filed Vitals:   01/19/16 0247 01/19/16 0319 01/19/16 0429 01/19/16 0504  BP: 129/94 129/94 118/76 110/70  Pulse: 118 112 115 109  Temp:      TempSrc:      Resp:  16 19 21   SpO2:  100% 100% 99%     Physical Exam 0200: Physical examination:  Nursing notes reviewed; Vital signs and O2 SAT reviewed;  Constitutional: Well developed, Well nourished, Well hydrated, In no acute distress; Head:  Normocephalic, atraumatic; Eyes: EOMI, PERRL, No scleral icterus; ENMT: Mouth and pharynx normal, Mucous membranes moist; Neck: Supple, Full range of motion, No lymphadenopathy; Cardiovascular: Tachycardic rate and rhythm, No gallop; Respiratory: Breath sounds clear & equal bilaterally, No wheezes.  Speaking full sentences with ease, Normal respiratory effort/excursion; Chest: Nontender, Movement normal; Abdomen: Soft, Nontender, Nondistended, Normal bowel sounds;; Extremities: Pulses normal, No tenderness, No edema, No calf edema or asymmetry.; Neuro: AA&Ox3, Major CN grossly intact.  Speech clear. No gross focal motor or sensory deficits in extremities.; Skin: Color normal, Warm, Dry.; Psych:  Rapid/pressured speech, endorses SI.    ED Course  Procedures (including critical care time)  Labs Review  Imaging Review  I have personally reviewed and evaluated these images and lab results as part of my medical decision-making.   EKG Interpretation   Date/Time:  Friday January 19 2016 01:58:49 EST Ventricular Rate:  135 PR Interval:  137 QRS Duration: 87 QT Interval:  308 QTC Calculation: 462 R Axis:   95 Text Interpretation:  Sinus tachycardia Atrial premature complex  Borderline right axis deviation Borderline T abnormalities, diffuse leads  Baseline wander When compared with ECG of  02/27/2015 No significant change  was found Confirmed by [facility]  MD, Nunzio Cory [phone]) on 01/19/2016 2:22:58  AM      MDM  MDM Reviewed: previous chart, nursing note and vitals Reviewed previous: labs and ECG Interpretation: labs and ECG     Results for orders placed or performed during the hospital encounter of 01/19/16  Acetaminophen level  Result Value Ref Range   Acetaminophen (Tylenol), Serum <10 (L) 10 - 30 ug/mL  Comprehensive metabolic panel  Result Value Ref Range   Sodium 135 135 - 145 mmol/L   Potassium 3.6 3.5 - 5.1 mmol/L   Chloride 104 101 - 111 mmol/L   CO2 18 (L) 22 - 32 mmol/L   Glucose, Bld 112 (H) 65 - 99 mg/dL   BUN 9 6 - 20 mg/dL   Creatinine, Ser 0.59 0.44 - 1.00 mg/dL   Calcium 8.7 (L) 8.9 - 10.3 mg/dL   Total Protein 8.4 (H) 6.5 -  8.1 g/dL   Albumin 4.4 3.5 - 5.0 g/dL   AST 17 15 - 41 U/L   ALT 12 (L) 14 - 54 U/L   Alkaline Phosphatase 93 38 - 126 U/L   Total Bilirubin 0.5 0.3 - 1.2 mg/dL   GFR calc non Af Amer >60 >60 mL/min   GFR calc Af Amer >60 >60 mL/min   Anion gap 13 5 - 15  Ethanol  Result Value Ref Range   Alcohol, Ethyl (B) 312 (HH) <5 mg/dL  Salicylate level  Result Value Ref Range   Salicylate Lvl 123456 2.8 - 30.0 mg/dL  CBC with Differential  Result Value Ref Range   WBC 11.7 (H) 4.0 - 10.5 K/uL   RBC 4.93 3.87 - 5.11 MIL/uL   Hemoglobin 10.4 (L) 12.0 - 15.0 g/dL   HCT 34.2 (L) 36.0 - 46.0 %   MCV 69.4 (L) 78.0 - 100.0 fL   MCH 21.1 (L) 26.0 - 34.0 pg   MCHC 30.4 30.0 - 36.0 g/dL   RDW 18.1 (H) 11.5 - 15.5 %   Platelets 570 (H) 150 - 400 K/uL   Neutrophils Relative % 51 %   Lymphocytes Relative 33 %   Monocytes Relative 7 %   Eosinophils Relative 8 %   Basophils Relative 1 %   Neutro Abs 6.0 1.7 - 7.7 K/uL   Lymphs Abs 3.9 0.7 - 4.0 K/uL   Monocytes Absolute 0.8 0.1 - 1.0 K/uL   Eosinophils Absolute 0.9 (H) 0.0 - 0.7 K/uL   Basophils Absolute 0.1 0.0 - 0.1 K/uL   Smear Review MORPHOLOGY UNREMARKABLE   Urine rapid drug  screen (hosp performed)  Result Value Ref Range   Opiates NONE DETECTED NONE DETECTED   Cocaine NONE DETECTED NONE DETECTED   Benzodiazepines NONE DETECTED NONE DETECTED   Amphetamines NONE DETECTED NONE DETECTED   Tetrahydrocannabinol NONE DETECTED NONE DETECTED   Barbiturates NONE DETECTED NONE DETECTED    0205:   Pt told ED RN that she "OD on 7 tramadol starting at 2000 last night until 0200." Pt told me she "took one motrin and 2 tramadol" at 2200. Unclear HPI. Poison Control contacted, labs ordered. Pt will need 6 hrs observation from LD tramadol. Will have TTS consult at 0800. IVF given for tachycardia. CIWA protocol ordered.   0315:  Pt remains voluntary at this time. Tachycardia improving with IVF. H/H per baseline. Pt continues to endorse SI, and cannot contract for safety, stating she would "still cut herself now if she could." Tearful at times, c/o anxiety. Will dose PO ativan.    0700:  Pt remains voluntary, cooperative with ED staff. Pt has ambulated with steady gait, easy resps, NAD. Will need TTS eval after 0800. Sign out to Dr. Canary Brim. Holding orders written.      Francine Graven, DO 01/19/16 [phone]

## 2016-01-19 NOTE — ED Notes (Signed)
Info pt ear rings have to come out . Pt ear rings have been place with pt belonging

## 2016-01-19 NOTE — ED Notes (Signed)
Samantha Howard from Poison control called to for an update on pt's status.

## 2016-01-19 NOTE — Discharge Instructions (Signed)
Follow up with out pt treatment for alcohol abuse

## 2016-01-19 NOTE — ED Provider Notes (Signed)
Pt states she is no longer suicidal and wants to go home and continue out pt tx for alcohol abuse.   Physical exam patient alert oriented times for not suicidal nor homicidal having no hallucinations patient will be discharged home  Samantha Ferguson, MD 01/19/16 1706

## 2016-01-19 NOTE — ED Notes (Signed)
Call to NP and VM left per patient request.  Call to Dr. Dewayne Hatch with patient request.  He states that he will come to speak with family within the hour concerning disposition.  [facility] AC aware.

## 2016-01-19 NOTE — BH Assessment (Addendum)
Assessment Note  Samantha Howard is an 35 y.o. female. Patient was brought was into the ED by [employer] because of suicidal thoughts with plan to overdose and alcohol intoxication.  On admission to the ED the patient's BAL was 312.  Patient current denying SI but admits on admission she was intoxicated and angry.  Patient reports reports a previous suicide attempt about twelve years ago when she overdosed and was taken to the emergency department but not treated in a behavioral inpatient setting.  Patient reports her current stressors are being out of work since December 28th for hysterectomy surgery(out for 6 to 8 weeks doctors leave), idle time at home, and drinking more since home.  Patient denies HI, A/VH. Patient reports using a stick pin to prick self when stressed out and this started about seven months ago.    Patient reports substance use includes: Alcohol- started at 35yo, 5 times a week, (3) 25 ounces beers, last used 1/19 Patient reports lost license in Q000111Q from a DUI and still does not have them returned yet.    Disposition Pending.    Diagnosis: Bipolar Disorder, Depressed type; Alcohol use, moderate  Past Medical History:  Past Medical History  Diagnosis Date  . Bipolar 1 disorder ([place])   . Seizures ([place])     Pt reports none in the last 10 years  . Depression   . Anemia   . Alcohol use [facility])     Past Surgical History  Procedure Laterality Date  . No past surgeries    . Robotic assisted total hysterectomy N/A 12/27/2015    Procedure: ROBOTIC ASSISTED TOTAL HYSTERECTOMY;  Surgeon: Bobbye Charleston, MD;  Location: [facility];  Service: Gynecology;  Laterality: N/A;  . Bilateral salpingectomy Bilateral 12/27/2015    Procedure: BILATERAL SALPINGECTOMY;  Surgeon: Bobbye Charleston, MD;  Location: [facility];  Service: Gynecology;  Laterality: Bilateral;  . Cystoscopy N/A 12/27/2015    Procedure: CYSTOSCOPY;  Surgeon: Bobbye Charleston, MD;  Location: [facility];  Service: Gynecology;   Laterality: N/A;    Family History: No family history on file.  Social History:  reports that she has been smoking Cigars.  She has never used smokeless tobacco. She reports that she drinks about 3.6 oz of alcohol per week. She reports that she does not use illicit drugs.  Additional Social History:  Alcohol / Drug Use Pain Medications: see chart Prescriptions: see chart Over the Counter: see chart History of alcohol / drug use?: Yes Longest period of sobriety (when/how long): couple weeks Negative Consequences of Use: Legal, Personal relationships, Work / School Withdrawal Symptoms: Irritability, Weakness, Nausea / Vomiting Substance #1 Name of Substance 1: Alcohol 1 - Age of First Use: 13 1 - Amount (size/oz): 3 25 ounce beers 1 - Frequency: 5 times weeks 1 - Duration: ongoing 1 - Last Use / Amount: 1/19  CIWA: CIWA-Ar BP: 111/88 mmHg Pulse Rate: 106 Nausea and Vomiting: no nausea and no vomiting Tactile Disturbances: none Tremor: no tremor Auditory Disturbances: not present Paroxysmal Sweats: no sweat visible Visual Disturbances: not present Anxiety: no anxiety, at ease Headache, Fullness in Head: none present Agitation: normal activity Orientation and Clouding of Sensorium: oriented and can do serial additions CIWA-Ar Total: 0 COWS:    Allergies:  Allergies  Allergen Reactions  . Latex Itching and Swelling    Home Medications:  (Not in a hospital admission)  OB/GYN Status:  Patient's last menstrual period was 11/14/2015 (approximate).  General Assessment Data Location of Assessment: [facility] ED TTS  Assessment: In system Is this a Tele or Face-to-Face Assessment?: Face-to-Face Is this an Initial Assessment or a Re-assessment for this encounter?: Initial Assessment Marital status: Married [place] name: Bridgers Is patient pregnant?: No Pregnancy Status: No Living Arrangements: Spouse/significant other, Children Can pt return to current living arrangement?:  Yes Admission Status: Voluntary Is patient capable of signing voluntary admission?: Yes Referral Source: Self/Family/Friend Insurance type: Insurance risk surveyor Exam ([place]) Medical Exam completed: Yes  Crisis Care Plan Living Arrangements: Spouse/significant other, Children Name of Psychiatrist: none Name of Therapist: none  Education Status Is patient currently in school?: No Current Grade: na Highest grade of school patient has completed: 12th Name of school: na Contact person: na  Risk to self with the past 6 months Suicidal Ideation: No Has patient been a risk to self within the past 6 months prior to admission? : Yes Suicidal Intent: No Has patient had any suicidal intent within the past 6 months prior to admission? : Yes Is patient at risk for suicide?: No Suicidal Plan?: No Has patient had any suicidal plan within the past 6 months prior to admission? : Yes Access to Means: No What has been your use of drugs/alcohol within the last 12 months?: alcohol Previous Attempts/Gestures: Yes How many times?: 2 Other Self Harm Risks: yes Triggers for Past Attempts: Unpredictable, Other (Comment) (alcohol) Intentional Self Injurious Behavior:  (stick pin) Family Suicide History: No Recent stressful life event(s): Conflict (Comment) Persecutory voices/beliefs?: No Depression: Yes Depression Symptoms: Loss of interest in usual pleasures, Feeling angry/irritable Substance abuse history and/or treatment for substance abuse?: Yes  Risk to Others within the past 6 months Homicidal Ideation: No-Not Currently/Within Last 6 Months Does patient have any lifetime risk of violence toward others beyond the six months prior to admission? : No Thoughts of Harm to Others: No-Not Currently Present/Within Last 6 Months Current Homicidal Intent: No-Not Currently/Within Last 6 Months Current Homicidal Plan: No-Not Currently/Within Last 6 Months Access to Homicidal Means:  No Identified Victim: na History of harm to others?: Yes Assessment of Violence: In past 6-12 months Violent Behavior Description: verbal aggression Does patient have access to weapons?: No Criminal Charges Pending?: No Does patient have a court date: No Is patient on probation?: No  Psychosis Hallucinations: None noted Delusions: None noted  Mental Status Report Appearance/Hygiene: In hospital gown Eye Contact: Good Motor Activity: Freedom of movement Speech: Logical/coherent Level of Consciousness: Alert Mood: Depressed Affect: Depressed Anxiety Level: None Thought Processes: Coherent, Relevant Judgement: Unimpaired Orientation: Person, Place, Time, Situation Obsessive Compulsive Thoughts/Behaviors: None  Cognitive Functioning Concentration: Normal Memory: Recent Intact, Remote Intact IQ: Average Insight: Fair Impulse Control: Fair Appetite: Fair Weight Loss: 0 Weight Gain: 0 Sleep: No Change Total Hours of Sleep: 0 Vegetative Symptoms: None  ADLScreening [facility] Assessment Services) Patient's cognitive ability adequate to safely complete daily activities?: Yes Patient able to express need for assistance with ADLs?: Yes Independently performs ADLs?: Yes (appropriate for developmental age)  Prior Inpatient Therapy Prior Inpatient Therapy: No Prior Therapy Dates: na  Prior Outpatient Therapy Prior Outpatient Therapy: Yes Prior Therapy Dates: 2007 Prior Therapy Facilty/Provider(s): [facility] Reason for Treatment: Bipolar Depression Does patient have an ACCT team?: No Does patient have Intensive In-House Services?  : No Does patient have [facility] services? : No Does patient have P4CC services?: No  ADL Screening (condition at time of admission) Patient's cognitive ability adequate to safely complete daily activities?: Yes Patient able to express need for assistance with ADLs?: Yes Independently  performs ADLs?: Yes (appropriate for developmental age)        Abuse/Neglect Assessment (Assessment to be complete while patient is alone) Physical Abuse: Denies Verbal Abuse: Denies Sexual Abuse: Denies Exploitation of patient/patient's resources: Denies Self-Neglect: Denies Values / Beliefs Cultural Requests During Hospitalization: None Spiritual Requests During Hospitalization: None Consults Spiritual Care Consult Needed: No Social Work Consult Needed: No Regulatory affairs officer (For Healthcare) Does patient have an advance directive?: No Would patient like information on creating an advanced directive?: No - patient declined information    Additional Information 1:1 In Past 12 Months?: No CIRT Risk: No Elopement Risk: No Does patient have medical clearance?: Yes     Disposition:  Disposition Initial Assessment Completed for this Encounter: Yes Disposition of Patient: Other dispositions (Pending) Other disposition(s): Other (Comment) ([place])  On Site Evaluation by:   Reviewed with Physician:    Chesley Noon A 01/19/2016 10:26 AM

## 2016-01-19 NOTE — Progress Notes (Signed)
The writer consulted with Dr. Loni Muse, it was recommended to refer for inpatient hospitalization for safety.  Patient meets criteria for 300 hall.     Chesley Noon, MSW, Darlyn Read [facility] Triage Specialist [phone]

## 2016-01-19 NOTE — ED Notes (Signed)
Patient presents via [employer] for SI with a plan to "OD on pills". Patient denies HI, AVH. Reports ETOH use. Patient appears to be paranoid with rapid speech.

## 2016-01-19 NOTE — ED Notes (Signed)
Bed: [facility] Expected date:  Expected time:  Means of arrival:  Comments: [id number]

## 2016-01-19 NOTE — Progress Notes (Signed)
Patient accepted to [facility], room 300-2. Clayborne Dana, RN

## 2016-01-19 NOTE — ED Notes (Signed)
Patient is pleasant and cooperative.  C/o "shakes" r/t etoh consumption.  Support,fluids, and prn medications given.

## 2016-01-19 NOTE — ED Notes (Signed)
Patient ambulatory independently to and from the bathroom in the department.

## 2016-01-19 NOTE — ED Notes (Signed)
Spoke with poison control, gave updated vitals and treatment plan/observation and assessment.

## 2016-01-19 NOTE — Consult Note (Signed)
[facility] Face-to-Face Psychiatry Consult   Reason for Consult:  Alcohol intoxication, Bipolar 1, Suicidal ideation Referring Physician:  EDP Patient Identification: Samantha Howard MRN:  [id number] Principal Diagnosis: Bipolar 1 disorder, mixed, severe ([place]) Diagnosis:   Patient Active Problem List   Diagnosis Date Noted  . Alcohol abuse [F10.10] 01/19/2016    Priority: High  . MDD (major depressive disorder), recurrent severe, without psychosis ([place]) [F33.2] 01/19/2016    Priority: High  . Bipolar 1 disorder, mixed, severe ([place]) [F31.63] 01/19/2016    Priority: High  . Postoperative state [Z98.890] 12/27/2015    Total Time spent with patient: 45 minutes  Subjective:   Samantha Howard is a 35 y.o. female patient admitted with reports of severe alcohol intoxication with plan to overdose. Pt seen and chart reviewed with NP/MD team this AM. Pt continues to feel suicidal and reports that she has major concerns about detoxing from alcohol (very high BAL). Pt meets inpatient criteria; will seek placement.   HPI:   Samantha Howard is an 35 y.o. female. Patient was brought was into the ED by [employer] because of suicidal thoughts with plan to overdose and alcohol intoxication. On admission to the ED the patient's BAL was 312. Patient current denying SI but admits on admission she was intoxicated and angry. Patient reports reports a previous suicide attempt about twelve years ago when she overdosed and was taken to the emergency department but not treated in a behavioral inpatient setting. Patient reports her current stressors are being out of work since December 28th for hysterectomy surgery(out for 6 to 8 weeks doctors leave), idle time at home, and drinking more since home. Patient denies HI, A/VH. Patient reports using a stick pin to prick self when stressed out and this started about seven months ago.   Patient reports substance use includes: Alcohol- started at 35yo, 5 times a week,  (3) 25 ounces beers, last used 1/19 Patient reports lost license in [id number] from a DUI and still does not have them returned yet.   Past Psychiatric History: MDD, ETOH abuse  Risk to Self: Suicidal Ideation: No Suicidal Intent: No Is patient at risk for suicide?: No Suicidal Plan?: No Access to Means: No What has been your use of drugs/alcohol within the last 12 months?: alcohol How many times?: 2 Other Self Harm Risks: yes Triggers for Past Attempts: Unpredictable, Other (Comment) (alcohol) Intentional Self Injurious Behavior:  (stick pin) Risk to Others: Homicidal Ideation: No-Not Currently/Within Last 6 Months Thoughts of Harm to Others: No-Not Currently Present/Within Last 6 Months Current Homicidal Intent: No-Not Currently/Within Last 6 Months Current Homicidal Plan: No-Not Currently/Within Last 6 Months Access to Homicidal Means: No Identified Victim: na History of harm to others?: Yes Assessment of Violence: In past 6-12 months Violent Behavior Description: verbal aggression Does patient have access to weapons?: No Criminal Charges Pending?: No Does patient have a court date: No Prior Inpatient Therapy: Prior Inpatient Therapy: No Prior Therapy Dates: na Prior Outpatient Therapy: Prior Outpatient Therapy: Yes Prior Therapy Dates: 2007 Prior Therapy Facilty/Provider(s): [facility] Reason for Treatment: Bipolar Depression Does patient have an ACCT team?: No Does patient have Intensive In-House Services?  : No Does patient have [facility] services? : No Does patient have P4CC services?: No  Past Medical History:  Past Medical History  Diagnosis Date  . Bipolar 1 disorder ([place])   . Seizures ([place])     Pt reports none in the last 10 years  . Depression   . Anemia   .  Alcohol use [facility])     Past Surgical History  Procedure Laterality Date  . No past surgeries    . Robotic assisted total hysterectomy N/A 12/27/2015    Procedure: ROBOTIC ASSISTED TOTAL HYSTERECTOMY;   Surgeon: Bobbye Charleston, MD;  Location: [place] [facility];  Service: Gynecology;  Laterality: N/A;  . Bilateral salpingectomy Bilateral 12/27/2015    Procedure: BILATERAL SALPINGECTOMY;  Surgeon: Bobbye Charleston, MD;  Location: [place] [facility];  Service: Gynecology;  Laterality: Bilateral;  . Cystoscopy N/A 12/27/2015    Procedure: CYSTOSCOPY;  Surgeon: Bobbye Charleston, MD;  Location: [facility];  Service: Gynecology;  Laterality: N/A;   Family History: No family history on file. Family Psychiatric  History: Unknown Social History:  History  Alcohol Use  . 3.6 oz/week  . 6 Cans of beer per week    Comment: socially     History  Drug Use No    Social History   Social History  . Marital Status: Single    Spouse Name: N/A  . Number of Children: N/A  . Years of Education: N/A   Social History Main Topics  . Smoking status: Current Every Day Smoker -- 1.00 packs/day    Types: Cigars  . Smokeless tobacco: Never Used  . Alcohol Use: 3.6 oz/week    6 Cans of beer per week     Comment: socially  . Drug Use: No  . Sexual Activity: Not Asked   Other Topics Concern  . None   Social History Narrative   Additional Social History:    Pain Medications: see chart Prescriptions: see chart Over the Counter: see chart History of alcohol / drug use?: Yes Longest period of sobriety (when/how long): couple weeks Negative Consequences of Use: Legal, Personal relationships, Work / School Withdrawal Symptoms: Irritability, Weakness, Nausea / Vomiting Name of Substance 1: Alcohol 1 - Age of First Use: 13 1 - Amount (size/oz): 3 25 ounce beers 1 - Frequency: 5 times weeks 1 - Duration: ongoing 1 - Last Use / Amount: 1/19                   Allergies:   Allergies  Allergen Reactions  . Latex Itching and Swelling    Labs:  Results for orders placed or performed during the hospital encounter of 01/19/16 (from the past 48 hour(s))  Urine rapid drug screen (hosp performed)     Status: None    Collection Time: 01/19/16  2:33 AM  Result Value Ref Range   Opiates NONE DETECTED NONE DETECTED   Cocaine NONE DETECTED NONE DETECTED   Benzodiazepines NONE DETECTED NONE DETECTED   Amphetamines NONE DETECTED NONE DETECTED   Tetrahydrocannabinol NONE DETECTED NONE DETECTED   Barbiturates NONE DETECTED NONE DETECTED    Comment:        DRUG SCREEN FOR MEDICAL PURPOSES ONLY.  IF CONFIRMATION IS NEEDED FOR ANY PURPOSE, NOTIFY LAB WITHIN 5 DAYS.        LOWEST DETECTABLE LIMITS FOR URINE DRUG SCREEN Drug Class       Cutoff (ng/mL) Amphetamine      1000 Barbiturate      200 Benzodiazepine   818 Tricyclics       299 Opiates          300 Cocaine          300 THC              50   Acetaminophen level     Status: Abnormal   Collection Time: 01/19/16  2:41 AM  Result Value Ref Range   Acetaminophen (Tylenol), Serum <10 (L) 10 - 30 ug/mL    Comment:        THERAPEUTIC CONCENTRATIONS VARY SIGNIFICANTLY. A RANGE OF 10-30 ug/mL MAY BE AN EFFECTIVE CONCENTRATION FOR MANY PATIENTS. HOWEVER, SOME ARE BEST TREATED AT CONCENTRATIONS OUTSIDE THIS RANGE. ACETAMINOPHEN CONCENTRATIONS >150 ug/mL AT 4 HOURS AFTER INGESTION AND >50 ug/mL AT 12 HOURS AFTER INGESTION ARE OFTEN ASSOCIATED WITH TOXIC REACTIONS.   Comprehensive metabolic panel     Status: Abnormal   Collection Time: 01/19/16  2:41 AM  Result Value Ref Range   Sodium 135 135 - 145 mmol/L   Potassium 3.6 3.5 - 5.1 mmol/L   Chloride 104 101 - 111 mmol/L   CO2 18 (L) 22 - 32 mmol/L   Glucose, Bld 112 (H) 65 - 99 mg/dL   BUN 9 6 - 20 mg/dL   Creatinine, Ser 0.59 0.44 - 1.00 mg/dL   Calcium 8.7 (L) 8.9 - 10.3 mg/dL   Total Protein 8.4 (H) 6.5 - 8.1 g/dL   Albumin 4.4 3.5 - 5.0 g/dL   AST 17 15 - 41 U/L   ALT 12 (L) 14 - 54 U/L   Alkaline Phosphatase 93 38 - 126 U/L   Total Bilirubin 0.5 0.3 - 1.2 mg/dL   GFR calc non Af Amer >60 >60 mL/min   GFR calc Af Amer >60 >60 mL/min    Comment: (NOTE) The eGFR has been calculated  using the CKD EPI equation. This calculation has not been validated in all clinical situations. eGFR's persistently <60 mL/min signify possible Chronic Kidney Disease.    Anion gap 13 5 - 15  Ethanol     Status: Abnormal   Collection Time: 01/19/16  2:41 AM  Result Value Ref Range   Alcohol, Ethyl (B) 312 (HH) <5 mg/dL    Comment:        LOWEST DETECTABLE LIMIT FOR SERUM ALCOHOL IS 5 mg/dL FOR MEDICAL PURPOSES ONLY CRITICAL RESULT CALLED TO, READ BACK BY AND VERIFIED WITH: Alanson Aly RN @ [phone] ON 03/19/21 BY C DAVIS   Salicylate level     Status: None   Collection Time: 01/19/16  2:41 AM  Result Value Ref Range   Salicylate Lvl <4.8 2.8 - 30.0 mg/dL  CBC with Differential     Status: Abnormal   Collection Time: 01/19/16  2:41 AM  Result Value Ref Range   WBC 11.7 (H) 4.0 - 10.5 K/uL   RBC 4.93 3.87 - 5.11 MIL/uL   Hemoglobin 10.4 (L) 12.0 - 15.0 g/dL   HCT 34.2 (L) 36.0 - 46.0 %   MCV 69.4 (L) 78.0 - 100.0 fL   MCH 21.1 (L) 26.0 - 34.0 pg   MCHC 30.4 30.0 - 36.0 g/dL   RDW 18.1 (H) 11.5 - 15.5 %   Platelets 570 (H) 150 - 400 K/uL   Neutrophils Relative % 51 %   Lymphocytes Relative 33 %   Monocytes Relative 7 %   Eosinophils Relative 8 %   Basophils Relative 1 %   Neutro Abs 6.0 1.7 - 7.7 K/uL   Lymphs Abs 3.9 0.7 - 4.0 K/uL   Monocytes Absolute 0.8 0.1 - 1.0 K/uL   Eosinophils Absolute 0.9 (H) 0.0 - 0.7 K/uL   Basophils Absolute 0.1 0.0 - 0.1 K/uL   Smear Review MORPHOLOGY UNREMARKABLE     Current Facility-Administered Medications  Medication Dose Route Frequency Provider Last Rate Last Dose  . alum &  mag hydroxide-simeth (MAALOX/MYLANTA) 200-200-20 MG/5ML suspension 30 mL  30 mL Oral PRN Francine Graven, DO      . LORazepam (ATIVAN) injection 0-4 mg  0-4 mg Intravenous 4 times per day Francine Graven, DO   0 mg at 01/19/16 0249   Followed by  . [START ON 01/21/2016] LORazepam (ATIVAN) injection 0-4 mg  0-4 mg Intravenous Q12H Francine Graven, DO      .  LORazepam (ATIVAN) tablet 0-4 mg  0-4 mg Oral 4 times per day Francine Graven, DO   1 mg at 01/19/16 1245   Followed by  . [START ON 01/21/2016] LORazepam (ATIVAN) tablet 0-4 mg  0-4 mg Oral Q12H Francine Graven, DO      . nicotine (NICODERM CQ - dosed in mg/24 hours) patch 21 mg  21 mg Transdermal Daily PRN Francine Graven, DO      . ondansetron [facility]) tablet 4 mg  4 mg Oral Q8H PRN Francine Graven, DO      . thiamine (VITAMIN B-1) tablet 100 mg  100 mg Oral Daily Francine Graven, DO   100 mg at 01/19/16 1245   Or  . thiamine (B-1) injection 100 mg  100 mg Intravenous Daily Francine Graven, DO       Current Outpatient Prescriptions  Medication Sig Dispense Refill  . EPINEPHrine (EPI-PEN) 0.3 mg/0.3 mL SOAJ injection Inject 0.3 mg into the muscle once as needed (severe allergic reaction).    Marland Kitchen ibuprofen (ADVIL,MOTRIN) 800 MG tablet Take 1 tablet (800 mg total) by mouth every 8 (eight) hours as needed (mild pain). 30 tablet 0  . traMADol (ULTRAM) 50 MG tablet Take 1 tablet (50 mg total) by mouth every 6 (six) hours as needed (pain if pt does not desire to take percocet). 30 tablet 1  . cyclobenzaprine (FLEXERIL) 5 MG tablet Take 1 tablet (5 mg total) by mouth 2 (two) times daily as needed for muscle spasms. (Patient not taking: Reported on 12/14/2015) 20 tablet 0  . ferrous sulfate 325 (65 FE) MG tablet Take 1 tablet (325 mg total) by mouth 2 (two) times daily with a meal. (Patient not taking: Reported on 12/14/2015) 60 tablet 0    Musculoskeletal: Strength & Muscle Tone: within normal limits Gait & Station: normal Patient leans: N/A  Psychiatric Specialty Exam: Review of Systems  Psychiatric/Behavioral: Positive for depression, suicidal ideas and substance abuse. The patient is nervous/anxious and has insomnia.   All other systems reviewed and are negative.   Blood pressure 116/53, pulse 108, temperature 98.9 F (37.2 C), temperature source Oral, resp. rate 16, last menstrual  period 11/14/2015, SpO2 99 %.There is no weight on file to calculate BMI.  General Appearance: Casual and Fairly Groomed  Engineer, water::  Good  Speech:  Clear and Coherent and Normal Rate  Volume:  Normal  Mood:  Anxious and Depressed  Affect:  Appropriate and Congruent  Thought Process:  Circumstantial  Orientation:  Full (Time, Place, and Person)  Thought Content:  WDL  Suicidal Thoughts:  Yes.  with intent/plan  Homicidal Thoughts:  No  Memory:  Immediate;   Fair Recent;   Fair Remote;   Fair  Judgement:  Fair  Insight:  Fair  Psychomotor Activity:  Normal  Concentration:  Fair  Recall:  [employer] [place]  Language: Fair  Akathisia:  No  Handed:    AIMS (if indicated):     Assets:  Communication Skills Desire for Improvement Resilience Social Support  ADL's:  Intact  Cognition:  WNL  Sleep:      Treatment Plan Summary: Daily contact with patient to assess and evaluate symptoms and progress in treatment and Medication management  Disposition: Recommend psychiatric Inpatient admission when medically cleared.  Benjamine Mola, FNP-BC 01/19/2016 3:49 PM Patient seen face-to-face for psychiatric evaluation, chart reviewed and case discussed with the physician extender and developed treatment plan. Reviewed the information documented and agree with the treatment plan. Corena Pilgrim, MD

## 2016-01-19 NOTE — ED Notes (Signed)
TTS at bedside. 

## 2016-01-19 NOTE — ED Notes (Signed)
Belonging already in the [facility]

## 2016-01-24 ENCOUNTER — Emergency Department (HOSPITAL_COMMUNITY)
Admission: EM | Admit: 2016-01-24 | Discharge: 2016-01-24 | Disposition: A | Discharge status: Other Acute Inpatient Hospital | Payer: BLUE CROSS/BLUE SHIELD | Attending: Emergency Medicine | Admitting: Emergency Medicine

## 2016-01-24 ENCOUNTER — Inpatient Hospital Stay (HOSPITAL_COMMUNITY)
Admission: AD | Admit: 2016-01-24 | Discharge: 2016-01-28 | DRG: 885 | Disposition: A | Discharge status: Home / Self Care | Payer: BLUE CROSS/BLUE SHIELD | Source: Intra-hospital | Attending: Psychiatry | Admitting: Psychiatry

## 2016-01-24 ENCOUNTER — Encounter (HOSPITAL_COMMUNITY): Payer: Self-pay

## 2016-01-24 DIAGNOSIS — F332 Major depressive disorder, recurrent severe without psychotic features: Principal | ICD-10-CM | POA: Diagnosis present

## 2016-01-24 DIAGNOSIS — Y9289 Other specified places as the place of occurrence of the external cause: Secondary | ICD-10-CM | POA: Insufficient documentation

## 2016-01-24 DIAGNOSIS — F1024 Alcohol dependence with alcohol-induced mood disorder: Secondary | ICD-10-CM | POA: Diagnosis present

## 2016-01-24 DIAGNOSIS — T404X4A Poisoning by other synthetic narcotics, undetermined, initial encounter: Secondary | ICD-10-CM | POA: Insufficient documentation

## 2016-01-24 DIAGNOSIS — Y9389 Activity, other specified: Secondary | ICD-10-CM | POA: Diagnosis not present

## 2016-01-24 DIAGNOSIS — F319 Bipolar disorder, unspecified: Secondary | ICD-10-CM | POA: Diagnosis not present

## 2016-01-24 DIAGNOSIS — F17210 Nicotine dependence, cigarettes, uncomplicated: Secondary | ICD-10-CM | POA: Insufficient documentation

## 2016-01-24 DIAGNOSIS — T510X4A Toxic effect of ethanol, undetermined, initial encounter: Secondary | ICD-10-CM | POA: Diagnosis not present

## 2016-01-24 DIAGNOSIS — T39314A Poisoning by propionic acid derivatives, undetermined, initial encounter: Secondary | ICD-10-CM | POA: Diagnosis present

## 2016-01-24 DIAGNOSIS — T50914A Poisoning by multiple unspecified drugs, medicaments and biological substances, undetermined, initial encounter: Secondary | ICD-10-CM

## 2016-01-24 DIAGNOSIS — Z91040 Latex allergy status: Secondary | ICD-10-CM | POA: Diagnosis not present

## 2016-01-24 DIAGNOSIS — F10929 Alcohol use, unspecified with intoxication, unspecified: Secondary | ICD-10-CM

## 2016-01-24 DIAGNOSIS — Y998 Other external cause status: Secondary | ICD-10-CM | POA: Insufficient documentation

## 2016-01-24 DIAGNOSIS — Z862 Personal history of diseases of the blood and blood-forming organs and certain disorders involving the immune mechanism: Secondary | ICD-10-CM | POA: Diagnosis not present

## 2016-01-24 DIAGNOSIS — F10129 Alcohol abuse with intoxication, unspecified: Secondary | ICD-10-CM | POA: Insufficient documentation

## 2016-01-24 DIAGNOSIS — F3181 Bipolar II disorder: Secondary | ICD-10-CM | POA: Diagnosis present

## 2016-01-24 DIAGNOSIS — F329 Major depressive disorder, single episode, unspecified: Secondary | ICD-10-CM | POA: Diagnosis present

## 2016-01-24 LAB — CBC
HEMATOCRIT: 32.6 % — AB (ref 36.0–46.0)
HEMOGLOBIN: 9.8 g/dL — AB (ref 12.0–15.0)
MCH: 21.5 pg — AB (ref 26.0–34.0)
MCHC: 30.1 g/dL (ref 30.0–36.0)
MCV: 71.5 fL — AB (ref 78.0–100.0)
Platelets: 384 10*3/uL (ref 150–400)
RBC: 4.56 MIL/uL (ref 3.87–5.11)
RDW: 18.9 % — ABNORMAL HIGH (ref 11.5–15.5)
WBC: 8.5 10*3/uL (ref 4.0–10.5)

## 2016-01-24 LAB — COMPREHENSIVE METABOLIC PANEL
ALK PHOS: 84 U/L (ref 38–126)
ALT: 10 U/L — AB (ref 14–54)
AST: 15 U/L (ref 15–41)
Albumin: 4.1 g/dL (ref 3.5–5.0)
Anion gap: 9 (ref 5–15)
BUN: 7 mg/dL (ref 6–20)
CALCIUM: 8.6 mg/dL — AB (ref 8.9–10.3)
CHLORIDE: 106 mmol/L (ref 101–111)
CO2: 21 mmol/L — AB (ref 22–32)
CREATININE: 0.6 mg/dL (ref 0.44–1.00)
GFR calc Af Amer: >60 mL/min (ref >60)
GFR calc non Af Amer: >60 mL/min (ref >60)
Glucose, Bld: 100 mg/dL — ABNORMAL HIGH (ref 65–99)
Potassium: 3.3 mmol/L — ABNORMAL LOW (ref 3.5–5.1)
SODIUM: 136 mmol/L (ref 135–145)
Total Bilirubin: 0.2 mg/dL — ABNORMAL LOW (ref 0.3–1.2)
Total Protein: 7.7 g/dL (ref 6.5–8.1)

## 2016-01-24 LAB — RAPID URINE DRUG SCREEN, HOSP PERFORMED
AMPHETAMINES: NOT DETECTED
BARBITURATES: NOT DETECTED
Benzodiazepines: NOT DETECTED
Cocaine: NOT DETECTED
Opiates: NOT DETECTED
TETRAHYDROCANNABINOL: NOT DETECTED

## 2016-01-24 LAB — ACETAMINOPHEN LEVEL: Acetaminophen (Tylenol), Serum: <10 ug/mL — ABNORMAL LOW (ref 10–30)

## 2016-01-24 LAB — SALICYLATE LEVEL: Salicylate Lvl: <4 mg/dL (ref 2.8–30.0)

## 2016-01-24 LAB — ETHANOL: Alcohol, Ethyl (B): 261 mg/dL — ABNORMAL HIGH (ref <5)

## 2016-01-24 LAB — CBG MONITORING, ED: GLUCOSE-CAPILLARY: 95 mg/dL (ref 65–99)

## 2016-01-24 MED ORDER — IBUPROFEN 800 MG PO TABS: 800.0000 mg | ORAL_TABLET | Freq: Three times a day (TID) | ORAL | Status: DC | PRN

## 2016-01-24 MED ORDER — LORAZEPAM 1 MG PO TABS
1.0000 mg | ORAL_TABLET | Freq: Four times a day (QID) | ORAL | Status: AC
  Administered 2016-01-25 (×4): 1 mg via ORAL
  Filled 2016-01-24 (×4): qty 1

## 2016-01-24 MED ORDER — LORAZEPAM 1 MG PO TABS: 1.0000 mg | ORAL_TABLET | Freq: Four times a day (QID) | ORAL | Status: AC | PRN

## 2016-01-24 MED ORDER — LORAZEPAM 1 MG PO TABS
1.0000 mg | ORAL_TABLET | Freq: Every day | ORAL | Status: AC
  Administered 2016-01-28: 1 mg via ORAL
  Filled 2016-01-24: qty 1

## 2016-01-24 MED ORDER — VITAMIN B-1 100 MG PO TABS
100.0000 mg | ORAL_TABLET | Freq: Every day | ORAL | Status: DC
  Administered 2016-01-25 – 2016-01-28 (×4): 100 mg via ORAL
  Filled 2016-01-24 (×5): qty 1

## 2016-01-24 MED ORDER — LORAZEPAM 1 MG PO TABS
1.0000 mg | ORAL_TABLET | Freq: Four times a day (QID) | ORAL | Status: DC | PRN
  Filled 2016-01-24: qty 1

## 2016-01-24 MED ORDER — ACETAMINOPHEN 325 MG PO TABS: 650.0000 mg | ORAL_TABLET | Freq: Four times a day (QID) | ORAL | Status: DC | PRN

## 2016-01-24 MED ORDER — LORAZEPAM 2 MG/ML IJ SOLN: 0.0000 mg | Freq: Four times a day (QID) | INTRAMUSCULAR | Status: DC

## 2016-01-24 MED ORDER — ADULT MULTIVITAMIN W/MINERALS CH
1.0000 | ORAL_TABLET | Freq: Every day | ORAL | Status: DC
  Administered 2016-01-24: 1 via ORAL
  Filled 2016-01-24 (×2): qty 1

## 2016-01-24 MED ORDER — TRAMADOL HCL 50 MG PO TABS: 50.0000 mg | ORAL_TABLET | Freq: Four times a day (QID) | ORAL | Status: DC | PRN

## 2016-01-24 MED ORDER — DIPHENHYDRAMINE HCL 25 MG PO CAPS: 25.0000 mg | ORAL_CAPSULE | Freq: Three times a day (TID) | ORAL | Status: DC | PRN

## 2016-01-24 MED ORDER — LORAZEPAM 1 MG PO TABS: 1.0000 mg | ORAL_TABLET | Freq: Three times a day (TID) | ORAL | Status: DC

## 2016-01-24 MED ORDER — THIAMINE HCL 100 MG/ML IJ SOLN: 100.0000 mg | Freq: Once | INTRAMUSCULAR | Status: DC

## 2016-01-24 MED ORDER — ONDANSETRON 4 MG PO TBDP
4.0000 mg | ORAL_TABLET | Freq: Four times a day (QID) | ORAL | Status: AC | PRN
  Administered 2016-01-25: 4 mg via ORAL
  Filled 2016-01-24: qty 1

## 2016-01-24 MED ORDER — LORAZEPAM 1 MG PO TABS
1.0000 mg | ORAL_TABLET | Freq: Two times a day (BID) | ORAL | Status: AC
  Administered 2016-01-27 (×2): 1 mg via ORAL
  Filled 2016-01-24 (×2): qty 1

## 2016-01-24 MED ORDER — ALUM & MAG HYDROXIDE-SIMETH 200-200-20 MG/5ML PO SUSP: 30.0000 mL | ORAL | Status: DC | PRN

## 2016-01-24 MED ORDER — ONDANSETRON 4 MG PO TBDP
4.0000 mg | ORAL_TABLET | Freq: Four times a day (QID) | ORAL | Status: DC | PRN
Start: 2016-01-24 — End: 2016-01-24

## 2016-01-24 MED ORDER — ONDANSETRON 8 MG PO TBDP
8.0000 mg | ORAL_TABLET | Freq: Once | ORAL | Status: AC
  Administered 2016-01-24: 8 mg via ORAL
  Filled 2016-01-24: qty 1

## 2016-01-24 MED ORDER — IBUPROFEN 800 MG PO TABS
800.0000 mg | ORAL_TABLET | Freq: Three times a day (TID) | ORAL | Status: DC | PRN
  Administered 2016-01-25 – 2016-01-27 (×4): 800 mg via ORAL
  Filled 2016-01-24 (×4): qty 1

## 2016-01-24 MED ORDER — LOPERAMIDE HCL 2 MG PO CAPS: 2.0000 mg | ORAL_CAPSULE | ORAL | Status: DC | PRN

## 2016-01-24 MED ORDER — ADULT MULTIVITAMIN W/MINERALS CH
1.0000 | ORAL_TABLET | Freq: Every day | ORAL | Status: DC
  Administered 2016-01-25 – 2016-01-28 (×4): 1 via ORAL
  Filled 2016-01-24 (×5): qty 1

## 2016-01-24 MED ORDER — MAGNESIUM HYDROXIDE 400 MG/5ML PO SUSP: 30.0000 mL | Freq: Every day | ORAL | Status: DC | PRN

## 2016-01-24 MED ORDER — HYDROXYZINE HCL 25 MG PO TABS
25.0000 mg | ORAL_TABLET | Freq: Four times a day (QID) | ORAL | Status: AC | PRN
  Administered 2016-01-26: 25 mg via ORAL
  Filled 2016-01-24 (×2): qty 1

## 2016-01-24 MED ORDER — HYDROXYZINE HCL 25 MG PO TABS: 25.0000 mg | ORAL_TABLET | Freq: Four times a day (QID) | ORAL | Status: DC | PRN

## 2016-01-24 MED ORDER — VITAMIN B-1 100 MG PO TABS
100.0000 mg | ORAL_TABLET | Freq: Every day | ORAL | Status: DC
  Administered 2016-01-24: 100 mg via ORAL
  Filled 2016-01-24: qty 1

## 2016-01-24 MED ORDER — LORAZEPAM 1 MG PO TABS
1.0000 mg | ORAL_TABLET | Freq: Four times a day (QID) | ORAL | Status: DC
  Administered 2016-01-24 (×2): 1 mg via ORAL
  Filled 2016-01-24: qty 1

## 2016-01-24 MED ORDER — DIPHENHYDRAMINE HCL 25 MG PO CAPS
25.0000 mg | ORAL_CAPSULE | Freq: Three times a day (TID) | ORAL | Status: DC | PRN
  Administered 2016-01-24: 25 mg via ORAL
  Filled 2016-01-24: qty 1

## 2016-01-24 MED ORDER — LOPERAMIDE HCL 2 MG PO CAPS: 2.0000 mg | ORAL_CAPSULE | ORAL | Status: AC | PRN

## 2016-01-24 MED ORDER — LORAZEPAM 1 MG PO TABS: 1.0000 mg | ORAL_TABLET | Freq: Two times a day (BID) | ORAL | Status: DC

## 2016-01-24 MED ORDER — LORAZEPAM 1 MG PO TABS
1.0000 mg | ORAL_TABLET | Freq: Three times a day (TID) | ORAL | Status: AC
  Administered 2016-01-26 (×3): 1 mg via ORAL
  Filled 2016-01-24 (×3): qty 1

## 2016-01-24 MED ORDER — LORAZEPAM 1 MG PO TABS: 1.0000 mg | ORAL_TABLET | Freq: Every day | ORAL | Status: DC

## 2016-01-24 MED ORDER — LORAZEPAM 2 MG/ML IJ SOLN: 0.0000 mg | Freq: Two times a day (BID) | INTRAMUSCULAR | Status: DC

## 2016-01-24 NOTE — ED Notes (Signed)
Pt got upset in the room with her wife and started to walk out, Dr Stark Jock aware, IVC in process

## 2016-01-24 NOTE — ED Notes (Signed)
Report called to RN Toby, [facility].  [employer] transport requested.

## 2016-01-24 NOTE — ED Notes (Signed)
Spoke with Poison Control/[place] for any -CNS, resp depression -Tachycardia -Higher doses may causehypotension and bradycardia -GI upset from ibuprofen -Seizure precautions -Monitor at least 6 hours

## 2016-01-24 NOTE — ED Notes (Signed)
Pt AAO x 3, visiting with family at present.  No distress, calm & cooperative.  Monitoring for safety, Q 15 min checks in effect.  Pending report & transfer to [facility].

## 2016-01-24 NOTE — BH Assessment (Signed)
Assessment Note  Samantha Howard is an 35 y.o. female. Patient was brought was into the ED by EMS because of suicidal attempt by overdose and alcohol intoxication. On admission to the ED the patient's BAL was 261 . Patient currently denying SI but admits on admission she was intoxicated and angry. She also reports on-going depression and anxiety. Patient reports reports a previous suicide attempt about twelve years ago when she overdosed and was taken to the emergency department but not treated in a behavioral inpatient setting. Patient reports her current stressors are being out of work since December 28th for hysterectomy surgery(out for 6 to 8 weeks doctors leave), idle time at home, and drinking more since home. Patient denies HI, A/VH. Patient reports using a stick pin to prick self when stressed out and this started about seven months ago. Patient reports substance use includes: Alcohol started at 35yo, 5 times a week since her surgery, (3) 25 ounces beers, last used 1/25. Patient reports lost license in [id number] from a DUI and still does not have them returned   Diagnosis: Depressive Disorder and Alcohol Use  Past Medical History:  Past Medical History  Diagnosis Date  . Bipolar 1 disorder ([place])   . Seizures ([place])     Pt reports none in the last 10 years  . Depression   . Anemia   . Alcohol use [facility])     Past Surgical History  Procedure Laterality Date  . No past surgeries    . Robotic assisted total hysterectomy N/A 12/27/2015    Procedure: ROBOTIC ASSISTED TOTAL HYSTERECTOMY;  Surgeon: Bobbye Charleston, MD;  Location: [facility];  Service: Gynecology;  Laterality: N/A;  . Bilateral salpingectomy Bilateral 12/27/2015    Procedure: BILATERAL SALPINGECTOMY;  Surgeon: Bobbye Charleston, MD;  Location: [facility];  Service: Gynecology;  Laterality: Bilateral;  . Cystoscopy N/A 12/27/2015    Procedure: CYSTOSCOPY;  Surgeon: Bobbye Charleston, MD;  Location: [place] [facility];  Service: Gynecology;   Laterality: N/A;    Family History: No family history on file.  Social History:  reports that she has been smoking Cigars.  She has never used smokeless tobacco. She reports that she drinks about 3.6 oz of alcohol per week. She reports that she does not use illicit drugs.  Additional Social History:  Alcohol / Drug Use Pain Medications: SEE MAR Prescriptions: SEE MAR Over the Counter: SEE MAR History of alcohol / drug use?: Yes Substance #1 Name of Substance 1: Alcohol  1 - Age of First Use: 35 yrs old  1 - Amount (size/oz): (3) 35 ounce beers 1 - Frequency: 5x's per week 1 - Duration: on-going  1 - Last Use / Amount: 01/23/2015  CIWA: CIWA-Ar BP: 136/68 mmHg Pulse Rate: 100 COWS:    Allergies:  Allergies  Allergen Reactions  . Latex Itching and Swelling    Home Medications:  (Not in a hospital admission)  OB/GYN Status:  Patient's last menstrual period was 11/14/2015 (approximate).  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 ) Is patient pregnant?: No Pregnancy Status: No Living Arrangements: Spouse/significant other, Children Can pt return to current living arrangement?: Yes Admission Status: Involuntary Is patient capable of signing voluntary admission?: Yes Referral Source: Self/Family/Friend Insurance type:  Nurse, mental health)     Crisis Care Plan Living Arrangements: Spouse/significant other, Children Legal  Guardian:  (No guardian ) Name of Psychiatrist: none Name of Therapist: none  Education Status Is patient currently in school?: No Current Grade:  (n/a) Highest grade of school patient has completed: 12th Name of school: na Contact person: na  Risk to self with the past 6 months Suicidal Ideation: No Has patient been a risk  to self within the past 6 months prior to admission? : No Has patient had any suicidal intent within the past 6 months prior to admission? : Yes Is patient at risk for suicide?: Yes Suicidal Plan?: No Has patient had any suicidal plan within the past 6 months prior to admission? : Yes Access to Means: No What has been your use of drugs/alcohol within the last 12 months?:  (alcohol ) Previous Attempts/Gestures: Yes How many times?:  (2x's) Other Self Harm Risks:  (yes) Triggers for Past Attempts: Unpredictable, Other (Comment) (alcohol) Intentional Self Injurious Behavior: None (stick pin) Family Suicide History: No Recent stressful life event(s): Conflict (Comment) Persecutory voices/beliefs?: No Depression: Yes Depression Symptoms: Feeling worthless/self pity, Loss of interest in usual pleasures, Guilt, Fatigue, Tearfulness, Isolating, Insomnia, Despondent, Feeling angry/irritable Substance abuse history and/or treatment for substance abuse?: Yes  Risk to Others within the past 6 months Homicidal Ideation: No Does patient have any lifetime risk of violence toward others beyond the six months prior to admission? : No Thoughts of Harm to Others: No Current Homicidal Intent: No Current Homicidal Plan: No Access to Homicidal Means: No Identified Victim:  (n/a) History of harm to others?: Yes Assessment of Violence: In past 6-12 months Violent Behavior Description:  (verbal aggression) Does patient have access to weapons?: No Criminal Charges Pending?: No Does patient have a court date: No Is patient on probation?: No  Psychosis Hallucinations: None noted Delusions: None noted  Mental Status Report Appearance/Hygiene: In hospital gown Eye Contact: Good Motor Activity: Freedom of movement Speech: Logical/coherent Level of Consciousness: Alert Mood: Depressed Affect: Depressed Anxiety Level: None Thought Processes: Coherent, Relevant Judgement: Unimpaired Orientation:  Person, Place, Time, Situation Obsessive Compulsive Thoughts/Behaviors: None  Cognitive Functioning Concentration: Normal Memory: Remote Intact, Recent Intact IQ: Average Insight: Fair Impulse Control: Fair Appetite: Fair Weight Loss:  (0) Weight Gain:  (0) Sleep: No Change Total Hours of Sleep:  (0) Vegetative Symptoms: None  ADLScreening [facility] Assessment Services) Patient's cognitive ability adequate to safely complete daily activities?: Yes Patient able to express need for assistance with ADLs?: Yes Independently performs ADLs?: Yes (appropriate for developmental age)  Prior Inpatient Therapy Prior Inpatient Therapy: No Prior Therapy Dates: na Prior Therapy Facilty/Provider(s):  (n/a) Reason for Treatment:  (n/a)  Prior Outpatient Therapy Prior Outpatient Therapy: Yes Prior Therapy Dates: 2007 Prior Therapy Facilty/Provider(s): [facility] Reason for Treatment: Bipolar Depression Does patient have an ACCT team?: No Does patient have Intensive In-House Services?  : No Does patient have [facility] services? : No Does patient have P4CC services?: No  ADL Screening (condition at time of admission) Patient's cognitive ability adequate to safely complete daily activities?: Yes Is the patient deaf or have difficulty hearing?: No Does the patient have difficulty seeing, even when wearing glasses/contacts?: No Does the patient have difficulty concentrating, remembering, or making decisions?: Yes Patient able to express need for assistance with ADLs?: Yes Does the patient have difficulty dressing or bathing?: No Independently performs ADLs?: Yes (appropriate for developmental age) Does the patient have difficulty walking or climbing stairs?: No Weakness of Legs: None  Home Assistive Devices/Equipment Home Assistive Devices/Equipment: None    Abuse/Neglect  Assessment (Assessment to be complete while patient is alone) Physical Abuse: Denies Verbal Abuse: Denies Sexual  Abuse: Denies Exploitation of patient/patient's resources: Denies Self-Neglect: Denies     Regulatory affairs officer (For Healthcare) Does patient have an advance directive?: No Would patient like information on creating an advanced directive?: No - patient declined information Nutrition Screen- [facility] Adult/[facility] Patient's home diet: Regular (NO PORK)  Additional Information 1:1 In Past 12 Months?: No CIRT Risk: No Elopement Risk: No Does patient have medical clearance?: Yes     Disposition:  Disposition Initial Assessment Completed for this Encounter: Yes Disposition of Patient: Inpatient treatment program (Per Reginold Agent, NP patient meets criteria for inpatient [place]) Type of inpatient treatment program: Adult Other disposition(s):  (Patient meets criteria for inpatient treatment)  On Site Evaluation by:   Reviewed with Physician:    Evangeline Gula 01/24/2016 1:28 PM

## 2016-01-24 NOTE — ED Notes (Signed)
Pt made aware that her wife has returned, but will have to wait until she has been assessed by [facility] staff.  Pt verbalized understanding.

## 2016-01-24 NOTE — ED Notes (Signed)
Pt vomited x 1.  Roanna Epley PA made aware and gave verbal order for ODT Zofran.  Per PA, Pt is still medically cleared.

## 2016-01-24 NOTE — ED Notes (Signed)
[employer] arrived to serve IVC paperwork.

## 2016-01-24 NOTE — ED Notes (Signed)
Patient arrives by EMS with complaint of overdose.  Patient took ~15 of the 800 mg ibuprofen and unknown amount of tramadol.  Patient states she drinks "alot".  Patient drank a 3 pack and a 40. Patient states she was on a "video thingy" and EMS states that patient's wife told them she "upended the pill bottle"

## 2016-01-24 NOTE — ED Notes (Signed)
Bed: RESA Expected date:  Expected time:  Means of arrival:  Comments: EMS  

## 2016-01-24 NOTE — ED Notes (Signed)
Pt oriented to room and unit.  Pt is c/o itching all over.  She denies pain and discomfort.  Denies SI, HI, and AVH.  Order obtained from NP.  15 minute checks and video monitoring in place.

## 2016-01-24 NOTE — ED Provider Notes (Signed)
CSN: [id number]     Arrival date & time 01/24/16  0440 History   First MD Initiated Contact with Patient 01/24/16 0444     Chief Complaint  Patient presents with  . Ingestion     (Consider location/radiation/quality/duration/timing/severity/associated sxs/prior Treatment) HPI   Patient possess the emergency department by EMS for overdose. She has a past medical history bipolar disorder, seizures, depression, anemia, alcohol use. She had a hysterectomy done course and of December that she is not yet been medically cleared to go back work. 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 She says that since she is been at home she's been very bored despite all of the things that her wife gets for her to do. She says she's been playing a lot of video games and drinking a lot of alcohol. This evening she was bored and the video chatting with her friend when they drink a lot of alcohol and then each took turns taking pills. She denies that she was intentionally trying to hurt herself but was just bored. She continually denies SI, HI, auditory or visual hallucinations. She admits to large amount of alcohol use. Denies abusing any other substances.  Per EMS is that it is estimated that she took 15 800 mg ibuprofen, an unknown amount of tramadol and potentially drank 3 or 4 beers. The patient says that she does not remember. She does not know how EMS was contacted. She also reports not knowing if her friend is okay.  Past Medical History  Diagnosis Date  . Bipolar 1 disorder ([place])   . Seizures ([place])     Pt reports none in the last 10 years  . Depression   . Anemia   . Alcohol use [facility])    Past Surgical History  Procedure Laterality Date  . No past surgeries    . Robotic assisted total hysterectomy N/A 12/27/2015    Procedure: ROBOTIC ASSISTED TOTAL HYSTERECTOMY;  Surgeon: Bobbye Charleston, MD;  Location: [facility];  Service: Gynecology;  Laterality: N/A;  . Bilateral salpingectomy Bilateral  12/27/2015    Procedure: BILATERAL SALPINGECTOMY;  Surgeon: Bobbye Charleston, MD;  Location: [facility];  Service: Gynecology;  Laterality: Bilateral;  . Cystoscopy N/A 12/27/2015    Procedure: CYSTOSCOPY;  Surgeon: Bobbye Charleston, MD;  Location: [facility];  Service: Gynecology;  Laterality: N/A;   No family history on file. Social History  Substance Use Topics  . Smoking status: Current Every Day Smoker -- 1.00 packs/day    Types: Cigars  . Smokeless tobacco: Never Used  . Alcohol Use: 3.6 oz/week    6 Cans of beer per week     Comment: socially   OB History    Gravida Para Term Preterm AB TAB SAB Ectopic Multiple Living   0              Review of Systems  Review of Systems All other systems negative except as documented in the HPI. All pertinent positives and negatives as reviewed in the HPI.   Allergies  Latex  Home Medications   Prior to Admission medications   Medication Sig Start Date End Date Taking? Authorizing Provider  EPINEPHrine (EPI-PEN) 0.3 mg/0.3 mL SOAJ injection Inject 0.3 mg into the muscle once as needed (severe allergic reaction).   Yes Historical Provider, MD  ibuprofen (ADVIL,MOTRIN) 800 MG tablet Take 1 tablet (800 mg total) by mouth every 8 (eight) hours as needed (mild pain). 12/28/15  Yes Bobbye Charleston, MD  traMADol Veatrice Bourbon) 50  MG tablet Take 1 tablet (50 mg total) by mouth every 6 (six) hours as needed (pain if pt does not desire to take percocet). 12/28/15  Yes Bobbye Charleston, MD  cyclobenzaprine (FLEXERIL) 5 MG tablet Take 1 tablet (5 mg total) by mouth 2 (two) times daily as needed for muscle spasms. Patient not taking: Reported on 12/14/2015 06/01/15   Delos Haring, PA-C  ferrous sulfate 325 (65 FE) MG tablet Take 1 tablet (325 mg total) by mouth 2 (two) times daily with a meal. Patient not taking: Reported on 12/14/2015 [id number]   Delora Fuel, MD   BP 123XX123 mmHg  Pulse 93  Temp(Src) 97.7 F (36.5 C) (Oral)  Resp 14  Ht 5\' 6"  (1.676 m)  Wt  78.019 kg  BMI 27.77 kg/m2  SpO2 98%  LMP 11/14/2015 (Approximate) Physical Exam  Constitutional: She appears well-developed and well-nourished. No distress.  HENT:  Head: Normocephalic and atraumatic.  Right Ear: Tympanic membrane and ear canal normal.  Left Ear: Tympanic membrane and ear canal normal.  Nose: Nose normal.  Mouth/Throat: Uvula is midline, oropharynx is clear and moist and mucous membranes are normal.  Eyes: Conjunctivae, EOM and lids are normal. Pupils are equal, round, and reactive to light.  Neck: Normal range of motion. Neck supple.  Cardiovascular: Normal rate and regular rhythm.   Pulmonary/Chest: Effort normal and breath sounds normal. She has no decreased breath sounds. She has no wheezes.  Abdominal: Soft.  No signs of abdominal distention  Neurological: She is alert.  intoxicated  Skin: Skin is warm and dry. No rash noted.  Psychiatric:  - Intoxicated - Depressed - Denies SI/HI  Nursing note and vitals reviewed.   ED Course  Procedures (including critical care time) Labs Review Labs Reviewed  CBC - Abnormal; Notable for the following:    Hemoglobin 9.8 (*)    HCT 32.6 (*)    MCV 71.5 (*)    MCH 21.5 (*)    RDW 18.9 (*)    All other components within normal limits  COMPREHENSIVE METABOLIC PANEL  ETHANOL  SALICYLATE LEVEL  ACETAMINOPHEN LEVEL  URINE RAPID DRUG SCREEN, HOSP PERFORMED  CBG MONITORING, ED    Imaging Review No results found. I have personally reviewed and evaluated these images and lab results as part of my medical decision-making.   EKG Interpretation  Date/Time:  Wednesday January 24 2016 04:50:19 EST Ventricular Rate:  97 PR Interval:  153 QRS Duration: 95 QT Interval:  383 QTC Calculation: 486 R Axis:   85 Text Interpretation:  Sinus rhythm Borderline prolonged QT interval  Confirmed by DELO  MD, DOUGLAS ([id number]) on 01/24/2016 4:59:03 AM      MDM   Final diagnoses:  None   Spoke with Poison  Control/[facility] for any -CNS, resp depression -Tachycardia -Higher doses may causehypotension and bradycardia -GI upset from ibuprofen -Seizure precautions -Monitor at least 6 hours  Pt currently has borderline prolonged QT, will monitor. She is protecting her airway, awake, alert, talking but obviously intoxicated.  At end of shift Patient will be taken over by Gaspar Skeeters, PA-C @ 6am. Please see Poison Controls recommendations and observe until 11 am. Will need repeat Tylenol at 9.    Delos Haring, PA-C 01/24/16 [id number]  Veryl Speak, MD 01/24/16 [phone]

## 2016-01-24 NOTE — ED Notes (Signed)
Bed: [facility] Expected date:  Expected time:  Means of arrival:  Comments: RM 17

## 2016-01-24 NOTE — ED Notes (Signed)
Pt has in her belongings bag a pair of blue pajama pants, blue spider-man slippers, a black t-shirt and a black white beater.

## 2016-01-24 NOTE — ED Provider Notes (Signed)
Sign-out received from PA-C T. Carlota Raspberry at 6AM. Samantha Howard is an 35 y.o. female with longstanding history of EtOH abuse and multiple psych co-morbidities who presents to the ED after ingestion. Admits to drinking several beers and had been playing a game with her friend on video chat where they "matched" each other taking pills of ibuprofen and Ultram. She has been a&o and hemodynamically stable in the ED, though obviously intoxicated. Per poison control, monitor pt for at least 6 hours and if at 6 hours she has had an uneventful stay, she is medically clear for psych evaluation. Will plan to repeat tylenol levels at 4h (9AM).   Physical Exam  BP 135/92 mmHg  Pulse 93  Temp(Src) 97.7 F (36.5 C) (Oral)  Resp 14  Ht 5\' 6"  (1.676 m)  Wt 78.019 kg  BMI 27.77 kg/m2  SpO2 98%  LMP 11/14/2015 (Approximate)  Physical Exam  Constitutional: She is oriented to person, place, and time. No distress.  HENT:  Right Ear: External ear normal.  Left Ear: External ear normal.  Nose: Nose normal.  Mouth/Throat: Oropharynx is clear and moist.  Eyes: Conjunctivae and EOM are normal.  Neck: Normal range of motion. Neck supple.  Cardiovascular: Normal rate, regular rhythm and normal heart sounds.   Pulmonary/Chest: Effort normal and breath sounds normal. No respiratory distress. She exhibits no tenderness.  Abdominal: Soft. She exhibits no distension. There is no tenderness.  Neurological: She is alert and oriented to person, place, and time. Coordination normal.  Skin: Skin is warm and dry. She is not diaphoretic.  Psychiatric: She has a normal mood and affect. Her speech is normal. She is not agitated and not combative.  Nursing note and vitals reviewed.     Filed Vitals:   01/24/16 0500 01/24/16 0500 01/24/16 0621 01/24/16 0827  BP: 135/92  135/92 136/68  Pulse: 93  90 100  Temp:  97.7 F (36.5 C)    TempSrc:  Oral    Resp: 14  16 14   Height:      Weight:      SpO2: 98%  97% 98%      ED Course  Procedures  MDM On my initial assessment pt was alert, oriented, and cooperative, though still visibly intoxicated. However around 0645 pt got out of bed, verbalizing to nursing staff that she wants to leave. Dr. Stark Jock placing pt in IVC.   Repeat tylenol levels <10. Pt is medically clear. TTS consulted .    Anne Ng, PA-C 01/24/16 1604  Veryl Speak, MD 01/29/16 [phone]

## 2016-01-24 NOTE — ED Notes (Signed)
Pt states that she had a hysterectomy in December and has been out of work since then, she has been depressed and bored, she hasn't been sleeping well either. Tonight she was talking to a friend on facebook and her friend was drinking and going through some things with her boyfriend, so they started having a drinking and pill popping contest, patient states that she wasn't trying to harm herself. She does admit that her life is hard right now and she is very bored but she doesn't want to kill herself.

## 2016-01-25 ENCOUNTER — Encounter (HOSPITAL_COMMUNITY): Payer: Self-pay

## 2016-01-25 DIAGNOSIS — F1024 Alcohol dependence with alcohol-induced mood disorder: Secondary | ICD-10-CM

## 2016-01-25 LAB — RETICULOCYTES
RBC.: 4.53 MIL/uL (ref 3.87–5.11)
Retic Count, Absolute: 49.8 10*3/uL (ref 19.0–186.0)
Retic Ct Pct: 1.1 % (ref 0.4–3.1)

## 2016-01-25 LAB — VITAMIN B12: VITAMIN B 12: 197 pg/mL (ref 180–914)

## 2016-01-25 LAB — IRON AND TIBC
IRON: 23 ug/dL — AB (ref 28–170)
SATURATION RATIOS: 5 % — AB (ref 10.4–31.8)
TIBC: 441 ug/dL (ref 250–450)
UIBC: 418 ug/dL

## 2016-01-25 LAB — FERRITIN: Ferritin: 10 ng/mL — ABNORMAL LOW (ref 11–307)

## 2016-01-25 LAB — FOLATE: FOLATE: 9.7 ng/mL (ref >5.9)

## 2016-01-25 MED ORDER — POTASSIUM CHLORIDE CRYS ER 10 MEQ PO TBCR
10.0000 meq | EXTENDED_RELEASE_TABLET | Freq: Two times a day (BID) | ORAL | Status: AC
  Administered 2016-01-25 – 2016-01-26 (×3): 10 meq via ORAL
  Filled 2016-01-25 (×6): qty 1

## 2016-01-25 MED ORDER — TRAZODONE HCL 50 MG PO TABS
50.0000 mg | ORAL_TABLET | Freq: Every day | ORAL | Status: DC
  Administered 2016-01-25: 50 mg via ORAL
  Filled 2016-01-25 (×4): qty 1

## 2016-01-25 MED ORDER — LURASIDONE HCL 40 MG PO TABS
40.0000 mg | ORAL_TABLET | Freq: Every day | ORAL | Status: DC
  Administered 2016-01-26 – 2016-01-28 (×3): 40 mg via ORAL
  Filled 2016-01-25 (×5): qty 1

## 2016-01-25 MED ORDER — NICOTINE 21 MG/24HR TD PT24
21.0000 mg | MEDICATED_PATCH | Freq: Every day | TRANSDERMAL | Status: DC
Start: 2016-01-25 — End: 2016-01-28
  Administered 2016-01-25 – 2016-01-28 (×4): 21 mg via TRANSDERMAL
  Filled 2016-01-25 (×5): qty 1

## 2016-01-25 NOTE — Tx Team (Signed)
Initial Interdisciplinary Treatment Plan   PATIENT STRESSORS: Legal issue Occupational concerns Substance abuse   PATIENT STRENGTHS: Capable of independent living Communication skills Special hobby/interest Supportive family/friends Work skills   PROBLEM LIST: Problem List/Patient Goals Date to be addressed Date deferred Reason deferred Estimated date of resolution  "Work on my attitude" 01/24/16     "Work on my drinking" 01/24/16     Anxiety 01/24/16     Depression 01/24/16     Substance abuse 01/24/16     Risk for Suicide 01/24/16                        DISCHARGE CRITERIA:  Ability to meet basic life and health needs Adequate post-discharge living arrangements Verbal commitment to aftercare and medication compliance  PRELIMINARY DISCHARGE PLAN: Outpatient therapy Return to previous living arrangement Return to previous work or school arrangements  PATIENT/FAMIILY INVOLVEMENT: This treatment plan has been presented to and reviewed with the patient, Samantha Howard, and/or family member.  The patient and family have been given the opportunity to ask questions and make suggestions.  Harshaan Whang T [name] 01/25/2016, 1:06 AM

## 2016-01-25 NOTE — BHH Group Notes (Signed)
[place] Group Notes:  (Nursing/MHT/Case Management/Adjunct)  Date:  01/25/2016  Time:  0900 am  Type of Therapy:  Psychoeducational Skills  Participation Level:  Active  Participation Quality:  Appropriate and Attentive  Affect:  Appropriate  Cognitive:  Alert and Appropriate  Insight:  Improving  Engagement in Group:  Supportive  Modes of Intervention:  Support  Summary of Progress/Problems:  Samantha Howard 01/25/2016, 10:38 AM

## 2016-01-25 NOTE — Progress Notes (Signed)
D-  Patient has been in her room resting for the majority of the shift.  Patient has had no complaints due withdrawals.  Patient denies SI, HI and AVH.  Patient also does not endorse depression or anxiety.     A-  Assess patient for safety, offer medications as prescribed, engage patient in 1:1 staff talks.   R- continue to monitor patient as prescribed.

## 2016-01-25 NOTE — BHH Suicide Risk Assessment (Signed)
[place] INPATIENT:  Family/Significant Other Suicide Prevention Education  Suicide Prevention Education:  Education Completed; Samantha Howard, Pt's wife [phone],  has been identified by the patient as the family member/significant other with whom the patient will be residing, and identified as the person(s) who will aid the patient in the event of a mental health crisis (suicidal ideations/suicide attempt).  With written consent from the patient, the family member/significant other has been provided the following suicide prevention education, prior to the and/or following the discharge of the patient.  The suicide prevention education provided includes the following:  Suicide risk factors  Suicide prevention and interventions  National Suicide Hotline telephone number  [facility] assessment telephone number  [facility] Emergency Assistance [place] and/or Residential Mobile Crisis Unit telephone number  Request made of family/significant other to:  Remove weapons (e.g., guns, rifles, knives), all items previously/currently identified as safety concern.    Remove drugs/medications (over-the-counter, prescriptions, illicit drugs), all items previously/currently identified as a safety concern.  The family member/significant other verbalizes understanding of the suicide prevention education information provided.  The family member/significant other agrees to remove the items of safety concern listed above.  Samantha Howard 01/25/2016, 5:08 PM

## 2016-01-25 NOTE — Progress Notes (Signed)
[place] Group Notes:  (Nursing/MHT/Case Management/Adjunct)  Date:  01/25/2016  Time:  2100  Type of Therapy:  Wrap up group  Participation Level:  Active  Participation Quality:  Appropriate, Attentive, Sharing and Supportive  Affect:  Blunted  Cognitive:  Appropriate  Insight:  Improving  Engagement in Group:  Engaged  Modes of Intervention:  Clarification, Education and Support  Summary of Progress/Problems: Pt shared that she had been on a 90 day alcohol binge. Pt wants to get to a place where she is self reliant and making grown up decisions. Pt enjoys bicycle riding and playing volleyball.   Jacques Navy 01/25/2016, 9:30 PM

## 2016-01-25 NOTE — Progress Notes (Signed)
NUTRITION ASSESSMENT  Pt identified as at risk on the Malnutrition Screen Tool  INTERVENTION: 1. Educated patient on the importance of nutrition and encouraged intake of food and beverages. 2. Discussed weight goals. 3. Supplements: none at this time  NUTRITION DIAGNOSIS: Unintentional weight loss related to sub-optimal intake as evidenced by pt report.   Goal: Pt to meet >/= 90% of their estimated nutrition needs.  Monitor:  PO intake  Assessment:  Pt seen for MST. Pt admitted for major depressive disorder. Pt drinks heavy most days due to boredom and being out of work. She has been out of work since having a hysterectomy last month. Per review, pt has lost 5 lbs (3% body weight) in the past 1 month which is not significant for time frame.   No supplements warranted at this time  35 y.o. female  Height: Ht Readings from Last 1 Encounters:  01/25/16 5\' 5"  (1.651 m)    Weight: Wt Readings from Last 1 Encounters:  01/25/16 170 lb (77.111 kg)    Weight Hx: Wt Readings from Last 10 Encounters:  01/25/16 170 lb (77.111 kg)  01/24/16 172 lb (78.019 kg)  12/27/15 170 lb (77.111 kg)  12/21/15 175 lb 2 oz (79.436 kg)  06/01/15 184 lb (83.462 kg)  02/26/15 176 lb (79.833 kg)  03/27/13 176 lb (79.833 kg)  06/22/12 169 lb (76.658 kg)    BMI:  Body mass index is 28.29 kg/(m^2). Pt meets criteria for overweight based on current BMI.  Estimated Nutritional Needs: Kcal: 25-30 kcal/kg Protein: > 1 gram protein/kg Fluid: 1 ml/kcal  Diet Order: Diet regular Room service appropriate?: Yes; Fluid consistency:: Thin Pt is also offered choice of unit snacks mid-morning and mid-afternoon.  Pt is eating as desired.   Lab results and medications reviewed.      Jarome Matin, RD, LDN Inpatient Clinical Dietitian Pager # [phone] After hours/weekend pager # [phone]

## 2016-01-25 NOTE — Tx Team (Signed)
Interdisciplinary Treatment Plan Update (Adult) Date: 01/25/2016   Date: 01/25/2016 12:57 PM  Progress in Treatment:  Attending groups: Pt is new to milieu, continuing to assess  Participating in groups: Pt is new to milieu, continuing to assess  Taking medication as prescribed: Yes  Tolerating medication: Yes  Family/Significant othe contact made: No, CSW attempting to make contact with wife. Patient understands diagnosis: Yes AEB seeking help with depression and alcohol abuse Discussing patient identified problems/goals with staff: Yes  Medical problems stabilized or resolved: Yes  Denies suicidal/homicidal ideation: Yes  Patient has not harmed self or Others: Yes   New problem(s) identified: None identified at this time.   Discharge Plan or Barriers: Pt will return home and follow-up with outpatient resources  Additional comments:  Patient and CSW reviewed pt's identified goals and treatment plan. Patient verbalized understanding and agreed to treatment plan. CSW reviewed [facility] "Discharge Process and Patient Involvement" Form. Pt verbalized understanding of information provided and signed form.   Reason for Continuation of Hospitalization:  Depression Medication stabilization Withdrawal symptoms  Estimated length of stay: 3-5 days  Review of initial/current patient goals per problem list:   1.  Goal(s): Patient will participate in aftercare plan  Met:  Yes  Target date: 3-5 days from date of admission   As evidenced by: Patient will participate within aftercare plan AEB aftercare provider and housing plan at discharge being identified.  01/25/16: Pt will return home and follow-up with outpatient resources  2.  Goal (s): Patient will exhibit decreased depressive symptoms and suicidal ideations.  Met:  No  Target date: 3-5 days from date of admission   As evidenced by: Patient will utilize self rating of depression at 3 or below and demonstrate decreased signs of  depression or be deemed stable for discharge by MD. 01/25/16: Pt was admitted with symptoms of depression, rating depressive symptoms highly. Pt continues to present with flat affect and depressive symptoms.  Pt will demonstrate decreased symptoms of depression and rate depression at 3/10 or lower prior to discharge.  4.  Goal(s): Patient will demonstrate decreased signs of withdrawal due to substance abuse  Met:  Progressing  Target date: 3-5 days from date of admission   As evidenced by: Patient will produce a CIWA/COWS score of 0, have stable vitals signs, and no symptoms of withdrawal - 01/25/16: Pt endorsing sweats, tremor, and upset stomach. Ativan protocol initiated; however, CIWA score of 0 at 1200.  Attendees:  Patient:    Family:    Physician: Dr. Parke Poisson, MD  01/25/2016 12:57 PM  Nursing: Lars Pinks, RN Case manager  01/25/2016 12:57 PM  Clinical Social Worker Peri Maris, [place] 01/25/2016 12:57 PM  Other: Tilden Fossa, [place] 01/25/2016 12:57 PM  Clinical: Mayra Neer RN 01/25/2016 12:57 PM  Other: , RN Charge Nurse 01/25/2016 12:57 PM  Other:     Peri Maris, [place] Work [phone]

## 2016-01-25 NOTE — [facility] Suicide Risk Assessment (Addendum)
[facility] Admission Suicide Risk Assessment   Nursing information obtained from:  Patient Demographic factors:  Adolescent or young adult, Abner Greenspan, lesbian, or bisexual orientation Current Mental Status:  NA Loss Factors:  Loss of significant relationship, Legal issues Historical Factors:  NA Risk Reduction Factors:  Responsible for children under 36 years of age, Sense of responsibility to family, Living with another person, especially a relative  Total Time spent with patient: 45 minutes Principal Problem:  Alcohol Dependence, Bipolar Disorder, Depressed  Diagnosis:   Patient Active Problem List   Diagnosis Date Noted  . Major depressive disorder, recurrent, severe without psychotic features ([place]) [F33.2] 01/24/2016  . Alcohol abuse [F10.10] 01/19/2016  . MDD (major depressive disorder), recurrent severe, without psychosis ([place]) [F33.2] 01/19/2016  . Bipolar 1 disorder, mixed, severe ([place]) [F31.63] 01/19/2016  . Postoperative state [Z98.890] 12/27/2015     Continued Clinical Symptoms:  Alcohol Use Disorder Identification Test Final Score (AUDIT): 16 The "Alcohol Use Disorders Identification Test", Guidelines for Use in Primary Care, Second Edition.  World Pharmacologist [facility]). Score between 0-7:  no or low risk or alcohol related problems. Score between 8-15:  moderate risk of alcohol related problems. Score between 16-19:  high risk of alcohol related problems. Score 20 or above:  warrants further diagnostic evaluation for alcohol dependence and treatment.   CLINICAL FACTORS:  35 year old female, lives with spouse and three children ( 69, 58, 7 ). Employed, but had been taking time off work due to hysterectomy 12/27/15.  States that she has been feeling depressed and " bored " over recent weeks .  Reports some neuro-vegetative symptoms of depression, mainly decreased sense of self esteem, low energy, poor sleep and appetite, denies anhedonia.  States that before she overdosed she had  not been having suicidal ideations. She states that especially since she has not been working she has been drinking more heavily, several times a week. Had been drinking  Up to 100 ounces of beer per day. She states she overdosed on Ibuprofen and tramadol, which had been prescribed to her from her surgery. States she took about " ten pills ". States she then told her spouse and was brought to hospital. States " I really did not want to die, I just wanted help, I feel like I am killing myself slowly with alcohol". " what I wanted was to get help to get off alcohol and start psychiatric medications that help". Patient endorses having been diagnosed with Bipolar Disorder in the past, and states she has a long history of insomnia. As above, also endorses history of Alcohol Abuse, which has been progressing in severity. She states she has not been on any psychiatric medications in more than one year. BAL 261 on admission Denies any medical illnesses - states she is allergic to percocet and  to latex . Hysterectomy   In December /16, for pelvic pain/ heavy menses .  Dx- Alcohol Dependence, alcohol WDL.  Plan - inpatient admission. Alcohol detoxification protocol with Ativan. Agrees to [place] trial- we discussed side effects. Trazodone for insomnia as needed. Labs to include HgbA1C, Lipid Panel, Prolactin level.      Musculoskeletal: Strength & Muscle Tone: within normal limits-  Slightly tremulous , feels vaguely " clammy". Gait & Station: normal Patient leans: N/A  Psychiatric Specialty Exam: ROS  Blood pressure 118/85, pulse 104, temperature 98.1 F (36.7 C), temperature source Oral, resp. rate 18, height 5\' 5"  (1.651 m), weight 170 lb (77.111 kg), last menstrual period 11/14/2015.Body  mass index is 28.29 kg/(m^2).  General Appearance: Well Groomed  Engineer, water::  Fair  Speech:  Normal Rate- not pressured   Volume:  Normal  Mood:  Depressed  Affect:  Congruent and Constricted  Thought Process:   Linear  Orientation:  Full (Time, Place, and Person)  Thought Content:  denies hallucinations, no delusions, not internally preoccupied   Suicidal Thoughts:  No- denies any suicidal or self injurious ideations, denies any homicidal ideations   Homicidal Thoughts:  No  Memory:  recent and remote grossly intact   Judgement:  Fair  Insight:  Present  Psychomotor Activity:  Normal- but mild distal tremors are noted  Concentration:  Good  Recall:  Good  Fund of Knowledge:Good  Language: Good  Akathisia:  Negative  Handed:  Right  AIMS (if indicated):     Assets:  Desire for Improvement Resilience Social Support Vocational/Educational  Sleep:  Number of Hours: 4.75  Cognition: WNL  ADL's:  Intact    COGNITIVE FEATURES THAT CONTRIBUTE TO RISK:  Closed-mindedness and Loss of executive function    SUICIDE RISK:   Moderate:  Frequent suicidal ideation with limited intensity, and duration, some specificity in terms of plans, no associated intent, good self-control, limited dysphoria/symptomatology, some risk factors present, and identifiable protective factors, including available and accessible social support.  PLAN OF CARE: Patient will be admitted to inpatient psychiatric unit for stabilization and safety. Will provide and encourage milieu participation. Provide medication management and maked adjustments as needed.  Will also provide medication management to minimize risk of alcohol WDL. Will follow daily.    I certify that inpatient services furnished can reasonably be expected to improve the patient's condition.   Neita Garnet, MD 01/25/2016, 4:08 PM

## 2016-01-25 NOTE — [facility] Group Notes (Signed)
[place] LCSW Group Therapy 01/25/2016 1:15pm  Type of Therapy: Group Therapy- Balance in Life  Participation Level: Minimal  Description of the Group:  The topic for group was balance in life. Today's group focused on defining balance in one's own words, identifying things that can knock one off balance, and exploring healthy ways to maintain balance in life. Group members were asked to provide an example of a time when they felt off balance, describe how they handled that situation,and process healthier ways to regain balance in the future. Group members were asked to share the most important tool for maintaining balance that they learned while at [facility] and how they plan to apply this method after discharge.  Summary of Patient Progress Pt declined to participate in group discussion; worked on a puzzle throughout session.    Therapeutic Modalities:   Cognitive Behavioral Therapy Solution-Focused Therapy Assertiveness Training   Peri Maris, Latanya Presser 01/25/2016 3:18 PM
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
## 2016-01-25 NOTE — Progress Notes (Signed)
Admission Notes  Pt is a 35 y/o AA female admitted onto the [facility] I/P adult unit. Pt at the time of admission endorses severe right side abdominal pain from recent surgery. Pt also endorses alcohol abuse; Pt however, denies depression, anxiety, AVH, SI, and HI. She states, "I am here just for my alcohol." Pt stated goals are "Work on my attitude" and "Work on my drinking." Support, encouragement, and safe environment provided.  15-minute safety checks initiated and continued. Pt remained calm and cooperative through the admission process.

## 2016-01-25 NOTE — H&P (Signed)
Psychiatric Admission Assessment Adult  Patient Identification: Sawyer Kahan MRN:  [id number] Date of Evaluation:  01/25/2016 Chief Complaint:  MDD Principal Diagnosis: Alcohol dependence with alcohol-induced mood disorder ([place]) Diagnosis:   Patient Active Problem List   Diagnosis Date Noted  . Alcohol dependence with alcohol-induced mood disorder ([place]) [F10.24]     Priority: High  . Major depressive disorder, recurrent, severe without psychotic features ([place]) [F33.2] 01/24/2016  . Alcohol abuse [F10.10] 01/19/2016  . MDD (major depressive disorder), recurrent severe, without psychosis ([place]) [F33.2] 01/19/2016  . Bipolar 1 disorder, mixed, severe ([place]) [F31.63] 01/19/2016  . Postoperative state [Z98.890] 12/27/2015   History of Present Illness:    Shamaya Kauer, 35 year old female, lives with spouse and three children ( 53, 71, 7 ). Employed, but had been taking time off work due to hysterectomy 12/27/15.   She reported that over recent weeke, she was bored and depression seem to worsen.  Patient reported some neuro-vegetative symptoms of depression, mainly decreased sense of self esteem, low energy, poor sleep and appetite, denies anhedonia.  She reports that her drinking also worsened and would drink several times a week.  Patient states that before she overdosed on Ibuprofen and Tramadol, which had been prescribed to her from her surgery.   She denied having suicidal ideations before the overdose.   She states she took about " ten pills" and then told her spouse who her brought to hospital.   She states that, " I really did not want to die, I just wanted help, I feel like I am killing myself slowly with alcohol" and that she only wanted to get some help to get off alcohol and start psychiatric medications that help". Patient endorses having been diagnosed with Bipolar Disorder in the past, and states she has a long history of insomnia. As above, also endorses history of Alcohol  Abuse, which has been progressing in severity. She states she has not been on any psychiatric medications in more than one year.  Her BAL 261 on admission  Associated Signs/Symptoms: Depression Symptoms:  depressed mood, anxiety, (Hypo) Manic Symptoms:  Labiality of Mood, Anxiety Symptoms:  Excessive Worry, Psychotic Symptoms:  NA PTSD Symptoms: NA Total Time spent with patient: 45 minutes  Past Psychiatric History:  See above noted  Risk to Self: Is patient at risk for suicide?: Yes What has been your use of drugs/alcohol within the last 12 months?: ETOH use: 2-25oz cans of beer normally; more recently 2-40oz and 3-25oz Risk to Others:   Prior Inpatient Therapy:   Prior Outpatient Therapy:    Alcohol Screening: 1. How often do you have a drink containing alcohol?: 2 to 3 times a week 2. How many drinks containing alcohol do you have on a typical day when you are drinking?: 3 or 4 3. How often do you have six or more drinks on one occasion?: Less than monthly Preliminary Score: 2 4. How often during the last year have you found that you were not able to stop drinking once you had started?: Monthly 5. How often during the last year have you failed to do what was normally expected from you becasue of drinking?: Less than monthly 6. How often during the last year have you needed a first drink in the morning to get yourself going after a heavy drinking session?: Weekly 7. How often during the last year have you had a feeling of guilt of remorse after drinking?: Never 8. How often during the last year have  you been unable to remember what happened the night before because you had been drinking?: Less than monthly 9. Have you or someone else been injured as a result of your drinking?: No 10. Has a relative or friend or a doctor or another health worker been concerned about your drinking or suggested you cut down?: Yes, during the last year Alcohol Use Disorder Identification Test Final Score  (AUDIT): 16 Brief Intervention: Yes Substance Abuse History in the last 12 months:  Yes.   Consequences of Substance Abuse: NA Previous Psychotropic Medications: Yes  Psychological Evaluations: Yes  Past Medical History:  Past Medical History  Diagnosis Date  . Bipolar 1 disorder ([place])   . Seizures ([place])     Pt reports none in the last 10 years  . Depression   . Anemia   . Alcohol use [facility])     Past Surgical History  Procedure Laterality Date  . No past surgeries    . Robotic assisted total hysterectomy N/A 12/27/2015    Procedure: ROBOTIC ASSISTED TOTAL HYSTERECTOMY;  Surgeon: Bobbye Charleston, MD;  Location: [facility];  Service: Gynecology;  Laterality: N/A;  . Bilateral salpingectomy Bilateral 12/27/2015    Procedure: BILATERAL SALPINGECTOMY;  Surgeon: Bobbye Charleston, MD;  Location: [facility];  Service: Gynecology;  Laterality: Bilateral;  . Cystoscopy N/A 12/27/2015    Procedure: CYSTOSCOPY;  Surgeon: Bobbye Charleston, MD;  Location: [facility];  Service: Gynecology;  Laterality: N/A;   Family History: History reviewed. No pertinent family history. Family Psychiatric  History:  Denies Social History:  History  Alcohol Use  . 3.6 oz/week  . 6 Cans of beer per week    Comment: socially     History  Drug Use No    Social History   Social History  . Marital Status: Married    Spouse Name: N/A  . Number of Children: N/A  . Years of Education: N/A   Social History Main Topics  . Smoking status: Current Every Day Smoker -- 1.00 packs/day    Types: Cigars  . Smokeless tobacco: Never Used  . Alcohol Use: 3.6 oz/week    6 Cans of beer per week     Comment: socially  . Drug Use: No  . Sexual Activity: Not Asked   Other Topics Concern  . None   Social History Narrative   Additional Social History:    Pain Medications: See MAR Prescriptions: See MAR Over the Counter: See PTA History of alcohol / drug use?: Yes Longest period of sobriety (when/how long): couple  weeks Negative Consequences of Use: Legal, Personal relationships, Work / School Withdrawal Symptoms: Irritability, Weakness, Nausea / Vomiting Name of Substance 1: Alcohol  1 - Age of First Use: 35 yrs old  1 - Amount (size/oz): (3) 35 ounce beers 1 - Frequency: 5x's per week 1 - Duration: on-going  1 - Last Use / Amount: 01/23/2015  Allergies:   Allergies  Allergen Reactions  . Latex Itching and Swelling   Lab Results:  Results for orders placed or performed during the hospital encounter of 01/24/16 (from the past 48 hour(s))  Comprehensive metabolic panel     Status: Abnormal   Collection Time: 01/24/16  4:47 AM  Result Value Ref Range   Sodium 136 135 - 145 mmol/L   Potassium 3.3 (L) 3.5 - 5.1 mmol/L   Chloride 106 101 - 111 mmol/L   CO2 21 (L) 22 - 32 mmol/L   Glucose, Bld 100 (H) 65 - 99 mg/dL  BUN 7 6 - 20 mg/dL   Creatinine, Ser 0.60 0.44 - 1.00 mg/dL   Calcium 8.6 (L) 8.9 - 10.3 mg/dL   Total Protein 7.7 6.5 - 8.1 g/dL   Albumin 4.1 3.5 - 5.0 g/dL   AST 15 15 - 41 U/L   ALT 10 (L) 14 - 54 U/L   Alkaline Phosphatase 84 38 - 126 U/L   Total Bilirubin 0.2 (L) 0.3 - 1.2 mg/dL   GFR calc non Af Amer >60 >60 mL/min   GFR calc Af Amer >60 >60 mL/min    Comment: (NOTE) The eGFR has been calculated using the CKD EPI equation. This calculation has not been validated in all clinical situations. eGFR's persistently <60 mL/min signify possible Chronic Kidney Disease.    Anion gap 9 5 - 15  Ethanol (ETOH)     Status: Abnormal   Collection Time: 01/24/16  4:47 AM  Result Value Ref Range   Alcohol, Ethyl (B) 261 (H) <5 mg/dL    Comment:        LOWEST DETECTABLE LIMIT FOR SERUM ALCOHOL IS 5 mg/dL FOR MEDICAL PURPOSES ONLY   Salicylate level     Status: None   Collection Time: 01/24/16  4:47 AM  Result Value Ref Range   Salicylate Lvl <2.2 2.8 - 30.0 mg/dL  Acetaminophen level     Status: Abnormal   Collection Time: 01/24/16  4:47 AM  Result Value Ref Range    Acetaminophen (Tylenol), Serum <10 (L) 10 - 30 ug/mL    Comment:        THERAPEUTIC CONCENTRATIONS VARY SIGNIFICANTLY. A RANGE OF 10-30 ug/mL MAY BE AN EFFECTIVE CONCENTRATION FOR MANY PATIENTS. HOWEVER, SOME ARE BEST TREATED AT CONCENTRATIONS OUTSIDE THIS RANGE. ACETAMINOPHEN CONCENTRATIONS >150 ug/mL AT 4 HOURS AFTER INGESTION AND >50 ug/mL AT 12 HOURS AFTER INGESTION ARE OFTEN ASSOCIATED WITH TOXIC REACTIONS.   CBC     Status: Abnormal   Collection Time: 01/24/16  4:47 AM  Result Value Ref Range   WBC 8.5 4.0 - 10.5 K/uL   RBC 4.56 3.87 - 5.11 MIL/uL   Hemoglobin 9.8 (L) 12.0 - 15.0 g/dL   HCT 32.6 (L) 36.0 - 46.0 %   MCV 71.5 (L) 78.0 - 100.0 fL   MCH 21.5 (L) 26.0 - 34.0 pg   MCHC 30.1 30.0 - 36.0 g/dL   RDW 18.9 (H) 11.5 - 15.5 %   Platelets 384 150 - 400 K/uL  CBG monitoring, ED     Status: None   Collection Time: 01/24/16  5:01 AM  Result Value Ref Range   Glucose-Capillary 95 65 - 99 mg/dL  Urine rapid drug screen (hosp performed) (Not at [facility])     Status: None   Collection Time: 01/24/16  6:01 AM  Result Value Ref Range   Opiates NONE DETECTED NONE DETECTED   Cocaine NONE DETECTED NONE DETECTED   Benzodiazepines NONE DETECTED NONE DETECTED   Amphetamines NONE DETECTED NONE DETECTED   Tetrahydrocannabinol NONE DETECTED NONE DETECTED   Barbiturates NONE DETECTED NONE DETECTED    Comment:        DRUG SCREEN FOR MEDICAL PURPOSES ONLY.  IF CONFIRMATION IS NEEDED FOR ANY PURPOSE, NOTIFY LAB WITHIN 5 DAYS.        LOWEST DETECTABLE LIMITS FOR URINE DRUG SCREEN Drug Class       Cutoff (ng/mL) Amphetamine      1000 Barbiturate      200 Benzodiazepine   482 Tricyclics  300 Opiates          300 Cocaine          300 THC              50   Acetaminophen level     Status: Abnormal   Collection Time: 01/24/16  9:18 AM  Result Value Ref Range   Acetaminophen (Tylenol), Serum <10 (L) 10 - 30 ug/mL    Comment:        THERAPEUTIC CONCENTRATIONS  VARY SIGNIFICANTLY. A RANGE OF 10-30 ug/mL MAY BE AN EFFECTIVE CONCENTRATION FOR MANY PATIENTS. HOWEVER, SOME ARE BEST TREATED AT CONCENTRATIONS OUTSIDE THIS RANGE. ACETAMINOPHEN CONCENTRATIONS >150 ug/mL AT 4 HOURS AFTER INGESTION AND >50 ug/mL AT 12 HOURS AFTER INGESTION ARE OFTEN ASSOCIATED WITH TOXIC REACTIONS.     Metabolic Disorder Labs:  No results found for: HGBA1C, MPG No results found for: PROLACTIN No results found for: CHOL, TRIG, HDL, CHOLHDL, VLDL, LDLCALC  Current Medications: Current Facility-Administered Medications  Medication Dose Route Frequency Provider Last Rate Last Dose  . acetaminophen (TYLENOL) tablet 650 mg  650 mg Oral Q6H PRN Delfin Gant, NP      . alum & mag hydroxide-simeth (MAALOX/MYLANTA) 200-200-20 MG/5ML suspension 30 mL  30 mL Oral Q4H PRN Delfin Gant, NP      . diphenhydrAMINE (BENADRYL) capsule 25 mg  25 mg Oral Q8H PRN Delfin Gant, NP      . hydrOXYzine (ATARAX/VISTARIL) tablet 25 mg  25 mg Oral Q6H PRN Delfin Gant, NP      . ibuprofen (ADVIL,MOTRIN) tablet 800 mg  800 mg Oral Q8H PRN Delfin Gant, NP   800 mg at 01/25/16 0003  . loperamide (IMODIUM) capsule 2-4 mg  2-4 mg Oral PRN Delfin Gant, NP      . LORazepam (ATIVAN) tablet 1 mg  1 mg Oral Q6H PRN Delfin Gant, NP      . LORazepam (ATIVAN) tablet 1 mg  1 mg Oral QID Delfin Gant, NP   1 mg at 01/25/16 1207   Followed by  . [START ON 01/26/2016] LORazepam (ATIVAN) tablet 1 mg  1 mg Oral TID Delfin Gant, NP       Followed by  . [START ON 01/27/2016] LORazepam (ATIVAN) tablet 1 mg  1 mg Oral BID Delfin Gant, NP       Followed by  . [START ON 01/28/2016] LORazepam (ATIVAN) tablet 1 mg  1 mg Oral Daily Delfin Gant, NP      . Derrill Memo ON 01/26/2016] lurasidone (LATUDA) tablet 40 mg  40 mg Oral Q breakfast Myer Peer Mykala Mccready, MD      . magnesium hydroxide (MILK OF MAGNESIA) suspension 30 mL  30 mL Oral Daily PRN Delfin Gant, NP      . multivitamin with minerals tablet 1 tablet  1 tablet Oral Daily Delfin Gant, NP   1 tablet at 01/25/16 0828  . nicotine (NICODERM CQ - dosed in mg/24 hours) patch 21 mg  21 mg Transdermal Daily Jenne Campus, MD   21 mg at 01/25/16 0828  . ondansetron (ZOFRAN-ODT) disintegrating tablet 4 mg  4 mg Oral Q6H PRN Delfin Gant, NP   4 mg at 01/25/16 0354  . potassium chloride (K-DUR,KLOR-CON) CR tablet 10 mEq  10 mEq Oral BID Myer Peer Amoria Mclees, MD      . thiamine (VITAMIN B-1) tablet 100 mg  100 mg Oral Daily Reginold Agent  Sharlene Motts, NP   100 mg at 01/25/16 0828  . traZODone (DESYREL) tablet 50 mg  50 mg Oral QHS Jenne Campus, MD       PTA Medications: Prescriptions prior to admission  Medication Sig Dispense Refill Last Dose  . ferrous sulfate 325 (65 FE) MG tablet Take 1 tablet (325 mg total) by mouth 2 (two) times daily with a meal. 60 tablet 0 01/24/2016 at Unknown time  . EPINEPHrine (EPI-PEN) 0.3 mg/0.3 mL SOAJ injection Inject 0.3 mg into the muscle once as needed (severe allergic reaction).   Unknown at Unknown time  . ibuprofen (ADVIL,MOTRIN) 800 MG tablet Take 1 tablet (800 mg total) by mouth every 8 (eight) hours as needed (mild pain). 30 tablet 0 Unknown at Unknown time    Musculoskeletal: Strength & Muscle Tone: within normal limits Gait & Station: normal Patient leans: N/A  Psychiatric Specialty Exam: Physical Exam  Vitals reviewed.   Review of Systems  All other systems reviewed and are negative.   Blood pressure 118/85, pulse 104, temperature 98.1 F (36.7 C), temperature source Oral, resp. rate 18, height _0  (1.651 m), weight 77.111 kg (170 lb), last menstrual period 11/14/2015.Body mass index is 28.29 kg/(m^2).   General Appearance: Well Groomed  Engineer, water:: Fair  Speech: Normal Rate- not pressured   Volume: Normal  Mood: Depressed  Affect: Congruent and Constricted  Thought Process: Linear  Orientation: Full (Time,  Place, and Person)  Thought Content: denies hallucinations, no delusions, not internally preoccupied   Suicidal Thoughts: No- denies any suicidal or self injurious ideations, denies any homicidal ideations   Homicidal Thoughts: No  Memory: recent and remote grossly intact   Judgement: Fair  Insight: Present  Psychomotor Activity: Normal- but mild distal tremors are noted  Concentration: Good  Recall: Good  Fund of Knowledge:Good  Language: Good  Akathisia: Negative  Handed: Right  AIMS (if indicated):    Assets: Desire for Improvement Resilience Social Support Vocational/Educational  Sleep: Number of Hours: 4.75  Cognition: WNL  ADL's: Intact       Treatment Plan Summary: Inpatient admission. Alcohol detoxification protocol with Ativan. Agrees to [place] trial- we discussed side effects. Trazodone for insomnia as needed. Labs to include HgbA1C, Lipid Panel, Prolactin level.   Observation Level/Precautions:  15 minute checks  Laboratory:  per ED  Psychotherapy:    Medications:    Consultations:    Discharge Concerns:    Estimated LOS:  Other:     I certify that inpatient services furnished can reasonably be expected to improve the patient's condition.   Freda Munro May Agustin AGNP-BC 1/26/20175:02 PM Patient case discussed with NP and patient seen by me  Agree with NP Note and Assessment  35 year old female, lives with spouse and three children ( 44, 1, 7 ). Employed, but had been taking time off work due to hysterectomy 12/27/15.  States that she has been feeling depressed and " bored " over recent weeks . Reports some neuro-vegetative symptoms of depression, mainly decreased sense of self esteem, low energy, poor sleep and appetite, denies anhedonia. States that before she overdosed she had not been having suicidal ideations. She states that especially since she has not been working she has been drinking more heavily, several times a week.  Had been drinking Up to 100 ounces of beer per day. She states she overdosed on Ibuprofen and tramadol, which had been prescribed to her from her surgery. States she took about " ten pills ".  States she then told her spouse and was brought to hospital. States " I really did not want to die, I just wanted help, I feel like I am killing myself slowly with alcohol". " what I wanted was to get help to get off alcohol and start psychiatric medications that help". Patient endorses having been diagnosed with Bipolar Disorder in the past, and states she has a long history of insomnia. As above, also endorses history of Alcohol Abuse, which has been progressing in severity. She states she has not been on any psychiatric medications in more than one year. BAL 261 on admission Denies any medical illnesses - states she is allergic to percocet and to latex . Hysterectomy In December /16, for pelvic pain/ heavy menses .  Dx- Alcohol Dependence, alcohol WDL.  Plan - inpatient admission. Alcohol detoxification protocol with Ativan. Agrees to [place] trial- we discussed side effects. Trazodone for insomnia as needed. Labs to include HgbA1C, Lipid Panel, Prolactin level.

## 2016-01-26 LAB — LIPID PANEL
Cholesterol: 169 mg/dL (ref 0–200)
HDL: 68 mg/dL (ref >40)
LDL Cholesterol: 82 mg/dL (ref 0–99)
TRIGLYCERIDES: 96 mg/dL (ref <150)
Total CHOL/HDL Ratio: 2.5 RATIO
VLDL: 19 mg/dL (ref 0–40)

## 2016-01-26 LAB — TSH: TSH: 1.667 u[IU]/mL (ref 0.350–4.500)

## 2016-01-26 MED ORDER — TRAZODONE HCL 50 MG PO TABS
50.0000 mg | ORAL_TABLET | Freq: Every evening | ORAL | Status: DC | PRN
  Administered 2016-01-26: 50 mg via ORAL

## 2016-01-26 MED ORDER — FERROUS SULFATE 325 (65 FE) MG PO TABS
325.0000 mg | ORAL_TABLET | Freq: Every day | ORAL | Status: DC
  Administered 2016-01-27 – 2016-01-28 (×2): 325 mg via ORAL
  Filled 2016-01-26 (×3): qty 1

## 2016-01-26 NOTE — [facility] Group Notes (Signed)
   [facility] LCSW Aftercare Discharge Planning Group Note  01/26/2016  8:45 AM   Participation Quality: Alert, Appropriate and Oriented  Mood/Affect: Depressed and Flat  Depression Rating: 8  Anxiety Rating: unable to rate  Thoughts of Suicide: Pt denies SI/HI  Will you contract for safety? Yes  Current AVH: Pt denies  Plan for Discharge/Comments: Pt attended discharge planning group and actively participated in group. CSW provided pt with today's workbook. Patient plans to return home to follow up with outpatient services.   Transportation Means: Pt reports access to transportation  Supports: No supports mentioned at this time  Tilden Fossa, MSW, [employer] Clinical Social Worker [employer] [phone]

## 2016-01-26 NOTE — Progress Notes (Signed)
Adult Psychoeducational Group Note  Date:  01/26/2016 Time:  9:25 PM  Group Topic/Focus:  Wrap-Up Group:   The focus of this group is to help patients review their daily goal of treatment and discuss progress on daily workbooks.  Participation Level:  Active  Participation Quality:  Appropriate  Affect:  Appropriate  Cognitive:  Alert  Insight: Appropriate  Engagement in Group:  Engaged  Modes of Intervention:  Discussion  Additional Comments:  Patient goal for today was to not have any bad thoughts, take her meds, and to not stay in bed all day. On a scale from 1-10 (1=worst, 10=best), patient rated her day at a 10.   Haydn Hutsell L Teagyn Fishel 01/26/2016, 9:25 PM

## 2016-01-26 NOTE — Progress Notes (Signed)
D: Pt is alert and oriented x4. Pt endorses mild anxiety; she states, "I may be going home on Sunday, just a little anxious about things." Pt also endorses mild abdominal pain from surgery. Pt denies SI, HI, anxiety and AVH. Pt was cooperative through the shift assessment.  A: Medications offered as prescribed.  Support, encouragement, and safe environment provided.  15-minute safety checks continue.  R: Pt was med compliant.  Pt attended wrap-up group. Safety checks continue.

## 2016-01-26 NOTE — Progress Notes (Signed)
D-  Patient reports anxiety and mild depression.  Patient signed a consent for voluntary treatment this shift.   Patient has been resting in the room for the majority of the shift.  Patient attended groups.  Patient has insight into her illness stating that she knows that she began to drink too much alcohol and she stopped taking her medications.  Patient plans to cease drinking and attend AA.  A-  Assess patient for safety, offer medications as prescribed, engage patient in 1:1 therapeutic talks  R-  Patient able to contract for safety.

## 2016-01-26 NOTE — Progress Notes (Signed)
Pt is alert and oriented x4. Pt endorses mild depression and anxiety; she states, "My partner was here today; I'm glad she came and now a little depressed because I'm missing her; I am also a little anxious."  Pt also endorses mild abdominal pain from surgery. Pt denies SI, HI and AVH. Pt remained respectful, calm and cooperative through the shift assessment. A: Medications offered as prescribed.  Support, encouragement, and safe environment provided.  15-minute safety checks continue.  R: Pt was med compliant.  Pt did not attend wrap-up group. Safety checks continue

## 2016-01-26 NOTE — [facility] Group Notes (Signed)
[facility] LCSW Group Therapy 01/26/2016 1:15pm  Type of Therapy: Group Therapy- Feelings Around Relapse and Recovery  Pt did not attend, declined invitation.    Peri Maris, Latanya Presser [phone] 01/26/2016 3:43 PM

## 2016-01-26 NOTE — Progress Notes (Signed)
[facility] MD Progress Note  01/26/2016 5:15 PM Samantha Howard  MRN:  [id number] Subjective:  Patient states she is feeling better, less depressed today. Of note, denies symptoms of alcohol withdrawal at this time. Objective : I have discussed case with treatment team and have met with patient . Patient reports partial improvement of mood and feels less depressed. At this time not presenting with symptoms of alcohol WDL- no tremors, no diaphoresis, no restlessness, BP WNL- remains tachycardic at times . She denies cravings for alcohol and states she is motivated in abstinence efforts . No disruptive or agitated behaviors on unit . Has been going to some groups, but tends to isolate in room. Staff reports that although patient reports improvement, affect remains constricted, depressed . Patient denies any SI.  She is tolerating medications well . Labs reviewed - see below  Principal Problem: Alcohol dependence with alcohol-induced mood disorder ([place]) Diagnosis:   Patient Active Problem List   Diagnosis Date Noted  . Alcohol dependence with alcohol-induced mood disorder ([place]) [F10.24]   . Major depressive disorder, recurrent, severe without psychotic features ([place]) [F33.2] 01/24/2016  . Alcohol abuse [F10.10] 01/19/2016  . MDD (major depressive disorder), recurrent severe, without psychosis ([place]) [F33.2] 01/19/2016  . Bipolar 1 disorder, mixed, severe ([place]) [F31.63] 01/19/2016  . Postoperative state [Z98.890] 12/27/2015   Total Time spent with patient: 20 minutes   Past Medical History:  Past Medical History  Diagnosis Date  . Bipolar 1 disorder ([place])   . Seizures ([place])     Pt reports none in the last 10 years  . Depression   . Anemia   . Alcohol use [facility])     Past Surgical History  Procedure Laterality Date  . No past surgeries    . Robotic assisted total hysterectomy N/A 12/27/2015    Procedure: ROBOTIC ASSISTED TOTAL HYSTERECTOMY;  Surgeon: Bobbye Charleston, MD;  Location: [facility];  Service: Gynecology;  Laterality: N/A;  . Bilateral salpingectomy Bilateral 12/27/2015    Procedure: BILATERAL SALPINGECTOMY;  Surgeon: Bobbye Charleston, MD;  Location: [facility];  Service: Gynecology;  Laterality: Bilateral;  . Cystoscopy N/A 12/27/2015    Procedure: CYSTOSCOPY;  Surgeon: Bobbye Charleston, MD;  Location: [place] [facility];  Service: Gynecology;  Laterality: N/A;   Family History: History reviewed. No pertinent family history. Social History:  History  Alcohol Use  . 3.6 oz/week  . 6 Cans of beer per week    Comment: socially     History  Drug Use No    Social History   Social History  . Marital Status: Married    Spouse Name: N/A  . Number of Children: N/A  . Years of Education: N/A   Social History Main Topics  . Smoking status: Current Every Day Smoker -- 1.00 packs/day    Types: Cigars  . Smokeless tobacco: Never Used  . Alcohol Use: 3.6 oz/week    6 Cans of beer per week     Comment: socially  . Drug Use: No  . Sexual Activity: Not Asked   Other Topics Concern  . None   Social History Narrative   Additional Social History:    Pain Medications: See MAR Prescriptions: See MAR Over the Counter: See PTA History of alcohol / drug use?: Yes Longest period of sobriety (when/how long): couple weeks Negative Consequences of Use: Legal, Personal relationships, Work / School Withdrawal Symptoms: Irritability, Weakness, Nausea / Vomiting Name of Substance 1: Alcohol  1 - Age of First Use: 35 yrs old  1 - Amount (size/oz): (3) 35 ounce beers 1 - Frequency: 5x's per week 1 - Duration: on-going  1 - Last Use / Amount: 01/23/2015  Sleep: Good  Appetite:  improved   Current Medications: Current Facility-Administered Medications  Medication Dose Route Frequency Provider Last Rate Last Dose  . acetaminophen (TYLENOL) tablet 650 mg  650 mg Oral Q6H PRN Delfin Gant, NP      . alum & mag hydroxide-simeth (MAALOX/MYLANTA) 200-200-20 MG/5ML suspension 30 mL   30 mL Oral Q4H PRN Delfin Gant, NP      . diphenhydrAMINE (BENADRYL) capsule 25 mg  25 mg Oral Q8H PRN Delfin Gant, NP      . hydrOXYzine (ATARAX/VISTARIL) tablet 25 mg  25 mg Oral Q6H PRN Delfin Gant, NP      . ibuprofen (ADVIL,MOTRIN) tablet 800 mg  800 mg Oral Q8H PRN Delfin Gant, NP   800 mg at 01/26/16 1013  . loperamide (IMODIUM) capsule 2-4 mg  2-4 mg Oral PRN Delfin Gant, NP      . LORazepam (ATIVAN) tablet 1 mg  1 mg Oral Q6H PRN Delfin Gant, NP      . LORazepam (ATIVAN) tablet 1 mg  1 mg Oral TID Delfin Gant, NP   1 mg at 01/26/16 1150   Followed by  . [START ON 01/27/2016] LORazepam (ATIVAN) tablet 1 mg  1 mg Oral BID Delfin Gant, NP       Followed by  . [START ON 01/28/2016] LORazepam (ATIVAN) tablet 1 mg  1 mg Oral Daily Delfin Gant, NP      . lurasidone (LATUDA) tablet 40 mg  40 mg Oral Q breakfast Jenne Campus, MD   40 mg at 01/26/16 0750  . magnesium hydroxide (MILK OF MAGNESIA) suspension 30 mL  30 mL Oral Daily PRN Delfin Gant, NP      . multivitamin with minerals tablet 1 tablet  1 tablet Oral Daily Delfin Gant, NP   1 tablet at 01/26/16 0748  . nicotine (NICODERM CQ - dosed in mg/24 hours) patch 21 mg  21 mg Transdermal Daily Jenne Campus, MD   21 mg at 01/26/16 0750  . ondansetron (ZOFRAN-ODT) disintegrating tablet 4 mg  4 mg Oral Q6H PRN Delfin Gant, NP   4 mg at 01/25/16 3382  . potassium chloride (K-DUR,KLOR-CON) CR tablet 10 mEq  10 mEq Oral BID Jenne Campus, MD   10 mEq at 01/26/16 0749  . thiamine (VITAMIN B-1) tablet 100 mg  100 mg Oral Daily Delfin Gant, NP   100 mg at 01/26/16 0748  . traZODone (DESYREL) tablet 50 mg  50 mg Oral QHS Jenne Campus, MD   50 mg at 01/25/16 2116    Lab Results:  Results for orders placed or performed during the hospital encounter of 01/24/16 (from the past 48 hour(s))  Vitamin B12     Status: None   Collection Time: 01/25/16  6:47  PM  Result Value Ref Range   Vitamin B-12 197 180 - 914 pg/mL    Comment: (NOTE) This assay is not validated for testing neonatal or myeloproliferative syndrome specimens for Vitamin B12 levels. Performed at [facility]   Folate     Status: None   Collection Time: 01/25/16  6:47 PM  Result Value Ref Range   Folate 9.7 >5.9 ng/mL    Comment: Performed at [place]  and TIBC     Status: Abnormal   Collection Time: 01/25/16  6:47 PM  Result Value Ref Range   Iron 23 (L) 28 - 170 ug/dL   TIBC 441 250 - 450 ug/dL   Saturation Ratios 5 (L) 10.4 - 31.8 %   UIBC 418 ug/dL    Comment: Performed at [facility]  Ferritin     Status: Abnormal   Collection Time: 01/25/16  6:47 PM  Result Value Ref Range   Ferritin 10 (L) 11 - 307 ng/mL    Comment: Performed at [facility]  Reticulocytes     Status: None   Collection Time: 01/25/16  6:47 PM  Result Value Ref Range   Retic Ct Pct 1.1 0.4 - 3.1 %   RBC. 4.53 3.87 - 5.11 MIL/uL   Retic Count, Manual 49.8 19.0 - 186.0 K/uL    Comment: Performed at [facility]  Lipid panel     Status: None   Collection Time: 01/26/16  6:51 AM  Result Value Ref Range   Cholesterol 169 0 - 200 mg/dL   Triglycerides 96 <150 mg/dL   HDL 68 >40 mg/dL   Total CHOL/HDL Ratio 2.5 RATIO   VLDL 19 0 - 40 mg/dL   LDL Cholesterol 82 0 - 99 mg/dL    Comment:        Total Cholesterol/HDL:CHD Risk Coronary Heart Disease Risk Table                     Men   Women  1/2 Average Risk   3.4   3.3  Average Risk       5.0   4.4  2 X Average Risk   9.6   7.1  3 X Average Risk  23.4   11.0        Use the calculated Patient Ratio above and the CHD Risk Table to determine the patient's CHD Risk.        ATP III CLASSIFICATION (LDL):  <100     mg/dL   Optimal  100-129  mg/dL   Near or Above                    Optimal  130-159  mg/dL   Borderline  160-189  mg/dL   High  >190     mg/dL   Very High Performed at  [facility]   TSH     Status: None   Collection Time: 01/26/16  6:51 AM  Result Value Ref Range   TSH 1.667 0.350 - 4.500 uIU/mL    Comment: Performed at [facility]    Physical Findings: AIMS: Facial and Oral Movements Muscles of Facial Expression: None, normal Lips and Perioral Area: None, normal Jaw: None, normal Tongue: None, normal,Extremity Movements Upper (arms, wrists, hands, fingers): None, normal Lower (legs, knees, ankles, toes): None, normal, Trunk Movements Neck, shoulders, hips: None, normal, Overall Severity Severity of abnormal movements (highest score from questions above): None, normal Incapacitation due to abnormal movements: None, normal Patient's awareness of abnormal movements (rate only patient's report): No Awareness, Dental Status Current problems with teeth and/or dentures?: No Does patient usually wear dentures?: No  CIWA:  CIWA-Ar Total: 0 COWS:  COWS Total Score: 5  Musculoskeletal: Strength & Muscle Tone: within normal limits Gait & Station: normal Patient leans: N/A  Psychiatric Specialty Exam: ROS  Denies headache, denies chest pain, no SOB, no vomiting   Blood  pressure 124/86, pulse 114, temperature 98.3 F (36.8 C), temperature source Oral, resp. rate 16, height 5' 5"  (1.651 m), weight 170 lb (77.111 kg), last menstrual period 11/14/2015.Body mass index is 28.29 kg/(m^2).  General Appearance: improved grooming   Eye Contact::  Good  Speech:  Normal Rate  Volume:  Normal  Mood:  states she is feeling better, minimizes depression at this time  Affect:  Appropriate, slightly constricted   Thought Process:  Linear  Orientation:  Full (Time, Place, and Person)  Thought Content:  denies hallucinations, no delusions   Suicidal Thoughts:  No denies any suicidal or self injurious ideations, denies any homicidal ideations   Homicidal Thoughts:  No  Memory:  recent and remote grossly intact   Judgement:   Improving    Insight:  Present  Psychomotor Activity:  Normal  Concentration:  Good  Recall:  Good  Fund of Knowledge:Negative  Language: Good  Akathisia:  Yes  Handed:  Right  AIMS (if indicated):     Assets:  Communication Skills Desire for Improvement Resilience  ADL's:  Intact  Cognition: WNL  Sleep:  Number of Hours: 6.75  Assessment-  Patient reports improved mood, and currently minimizes depression, although does present with slightly constricted affect. No SI or HI. No tremors, no diaphoresis, (+) tachycardia.  Anemia workup suggestive of iron deficiency anemia. Behavior on unit calm and in good control. Treatment Plan Summary: Daily contact with patient to assess and evaluate symptoms and progress in treatment, Medication management, Plan inpatient admission and medications as below  Encourage ongoing milieu , group participation to work on coping skills and symptom reduction Patient encouraged to abstain from alcohol completely , and to continue working on recovery efforts Encouraged to follow up with PCP regarding anemia management and follow up Continue Ativan detox protocol for alcohol WDL prevention/management Continue Latuda 40 mgrs QDAY for  Mood disorder  Continue Trazodone 50 mgrs QHS for insomnia as needed  Initiate Iron supplementation COBOS, FERNANDO 01/26/2016, 5:15 PM

## 2016-01-27 LAB — PROLACTIN: PROLACTIN: 50.2 ng/mL — AB (ref 4.8–23.3)

## 2016-01-27 LAB — HEMOGLOBIN A1C
HEMOGLOBIN A1C: 5.6 % (ref 4.8–5.6)
MEAN PLASMA GLUCOSE: 114 mg/dL

## 2016-01-27 MED ORDER — TRAZODONE HCL 100 MG PO TABS
100.0000 mg | ORAL_TABLET | Freq: Every evening | ORAL | Status: DC | PRN
  Administered 2016-01-27: 100 mg via ORAL
  Filled 2016-01-27: qty 1

## 2016-01-27 NOTE — Progress Notes (Signed)
D.  Pt pleasant on approach, no complaints voiced other than continued abdominal pain from hysterectomy she had in December.  Pt states pain is worse at night (see pain flowsheet).  Pt was positive for evening wrap up group, interacting appropriately with peers on the unit.  Denies SI/HI/hallucinations at this time.  A.  Support and encouragement offered, medication given as ordered  R.  Pt remains safe on the unit, will continue to monitor.

## 2016-01-27 NOTE — [facility] Group Notes (Signed)
[place] Group Notes:  (Nursing/MHT/Case Management/Adjunct)  Date:  01/27/2016  Time:  1:14 PM  Type of Therapy:  Psychoeducational Skills  Participation Level:  Active  Participation Quality:  Appropriate  Affect:  Appropriate  Cognitive:  Appropriate  Insight:  Appropriate  Engagement in Group:  Engaged  Modes of Intervention:  Problem-solving  Summary of Progress/Problems: Summary of Progress/Problems: Topic was on healthy coping skilss. Discussed the important of choosing healthy leisure activities. Group encouraged to surround themselves with positive and healthy group/support system when changing to a healthy life style.    Wolfgang Phoenix 01/27/2016, 1:14 PM

## 2016-01-27 NOTE — Progress Notes (Signed)
[facility] MD Progress Note  01/27/2016 11:06 AM Samantha Howard  MRN:  [id number]   Subjective:  Patient states" I am feeling better and I am ready to be discharged"  Objective :Samantha Howard is awake, alert and oriented X3, found sitting in the day room interacting with peers. Denies suicidal or homicidal ideation. Denies auditory or visual hallucination and does not appear to be responding to internal stimuli. Patient reports she interacts well with staff and others. Patient reports she is medication compliant without mediation side effects.  States her depression 2/10. Patient states "I am feeling better and I am ready to be discharged". Reports good appetite. Reports she is resting okay, however is awaken each time staff comes in the room with the flashlight. Patient report she is excited regarding discharge. Support, encouragement and reassurance was provided.   Principal Problem: Alcohol dependence with alcohol-induced mood disorder ([place]) Diagnosis:   Patient Active Problem List   Diagnosis Date Noted  . Alcohol dependence with alcohol-induced mood disorder ([place]) [F10.24]   . Major depressive disorder, recurrent, severe without psychotic features ([place]) [F33.2] 01/24/2016  . Alcohol abuse [F10.10] 01/19/2016  . MDD (major depressive disorder), recurrent severe, without psychosis ([place]) [F33.2] 01/19/2016  . Bipolar 1 disorder, mixed, severe ([place]) [F31.63] 01/19/2016  . Postoperative state [Z98.890] 12/27/2015   Total Time spent with patient: 20 minutes   Past Medical History:  Past Medical History  Diagnosis Date  . Bipolar 1 disorder ([place])   . Seizures ([place])     Pt reports none in the last 10 years  . Depression   . Anemia   . Alcohol use [facility])     Past Surgical History  Procedure Laterality Date  . No past surgeries    . Robotic assisted total hysterectomy N/A 12/27/2015    Procedure: ROBOTIC ASSISTED TOTAL HYSTERECTOMY;  Surgeon: Bobbye Charleston, MD;  Location: [place]  [facility];  Service: Gynecology;  Laterality: N/A;  . Bilateral salpingectomy Bilateral 12/27/2015    Procedure: BILATERAL SALPINGECTOMY;  Surgeon: Bobbye Charleston, MD;  Location: [facility];  Service: Gynecology;  Laterality: Bilateral;  . Cystoscopy N/A 12/27/2015    Procedure: CYSTOSCOPY;  Surgeon: Bobbye Charleston, MD;  Location: [facility];  Service: Gynecology;  Laterality: N/A;   Family History: History reviewed. No pertinent family history. Social History:  History  Alcohol Use  . 3.6 oz/week  . 6 Cans of beer per week    Comment: socially     History  Drug Use No    Social History   Social History  . Marital Status: Married    Spouse Name: N/A  . Number of Children: N/A  . Years of Education: N/A   Social History Main Topics  . Smoking status: Current Every Day Smoker -- 1.00 packs/day    Types: Cigars  . Smokeless tobacco: Never Used  . Alcohol Use: 3.6 oz/week    6 Cans of beer per week     Comment: socially  . Drug Use: No  . Sexual Activity: Not Asked   Other Topics Concern  . None   Social History Narrative   Additional Social History:    Pain Medications: See MAR Prescriptions: See MAR Over the Counter: See PTA History of alcohol / drug use?: Yes Longest period of sobriety (when/how long): couple weeks Negative Consequences of Use: Legal, Personal relationships, Work / School Withdrawal Symptoms: Irritability, Weakness, Nausea / Vomiting Name of Substance 1: Alcohol  1 - Age of First Use: 35 yrs old  1 -  Amount (size/oz): (3) 35 ounce beers 1 - Frequency: 5x's per week 1 - Duration: on-going  1 - Last Use / Amount: 01/23/2015  Sleep: Good  Appetite:  improved   Current Medications: Current Facility-Administered Medications  Medication Dose Route Frequency Provider Last Rate Last Dose  . acetaminophen (TYLENOL) tablet 650 mg  650 mg Oral Q6H PRN Delfin Gant, NP      . alum & mag hydroxide-simeth (MAALOX/MYLANTA) 200-200-20 MG/5ML suspension 30 mL   30 mL Oral Q4H PRN Delfin Gant, NP      . diphenhydrAMINE (BENADRYL) capsule 25 mg  25 mg Oral Q8H PRN Delfin Gant, NP      . ferrous sulfate tablet 325 mg  325 mg Oral Q breakfast Jenne Campus, MD   325 mg at 01/27/16 0810  . hydrOXYzine (ATARAX/VISTARIL) tablet 25 mg  25 mg Oral Q6H PRN Delfin Gant, NP   25 mg at 01/26/16 2109  . ibuprofen (ADVIL,MOTRIN) tablet 800 mg  800 mg Oral Q8H PRN Delfin Gant, NP   800 mg at 01/26/16 1013  . loperamide (IMODIUM) capsule 2-4 mg  2-4 mg Oral PRN Delfin Gant, NP      . LORazepam (ATIVAN) tablet 1 mg  1 mg Oral Q6H PRN Delfin Gant, NP      . LORazepam (ATIVAN) tablet 1 mg  1 mg Oral BID Delfin Gant, NP   1 mg at 01/27/16 0810   Followed by  . [START ON 01/28/2016] LORazepam (ATIVAN) tablet 1 mg  1 mg Oral Daily Delfin Gant, NP      . lurasidone (LATUDA) tablet 40 mg  40 mg Oral Q breakfast Jenne Campus, MD   40 mg at 01/27/16 0809  . magnesium hydroxide (MILK OF MAGNESIA) suspension 30 mL  30 mL Oral Daily PRN Delfin Gant, NP      . multivitamin with minerals tablet 1 tablet  1 tablet Oral Daily Delfin Gant, NP   1 tablet at 01/27/16 0809  . nicotine (NICODERM CQ - dosed in mg/24 hours) patch 21 mg  21 mg Transdermal Daily Jenne Campus, MD   21 mg at 01/27/16 0811  . ondansetron (ZOFRAN-ODT) disintegrating tablet 4 mg  4 mg Oral Q6H PRN Delfin Gant, NP   4 mg at 01/25/16 [id number]  . thiamine (VITAMIN B-1) tablet 100 mg  100 mg Oral Daily Delfin Gant, NP   100 mg at 01/27/16 0809  . traZODone (DESYREL) tablet 100 mg  100 mg Oral QHS PRN Derrill Center, NP        Lab Results:  Results for orders placed or performed during the hospital encounter of 01/24/16 (from the past 48 hour(s))  Vitamin B12     Status: None   Collection Time: 01/25/16  6:47 PM  Result Value Ref Range   Vitamin B-12 197 180 - 914 pg/mL    Comment: (NOTE) This assay is not validated for  testing neonatal or myeloproliferative syndrome specimens for Vitamin B12 levels. Performed at [facility]   Folate     Status: None   Collection Time: 01/25/16  6:47 PM  Result Value Ref Range   Folate 9.7 >5.9 ng/mL    Comment: Performed at [facility]  Iron and TIBC     Status: Abnormal   Collection Time: 01/25/16  6:47 PM  Result Value Ref Range   Iron 23 (L) 28 - 170  ug/dL   TIBC 441 250 - 450 ug/dL   Saturation Ratios 5 (L) 10.4 - 31.8 %   UIBC 418 ug/dL    Comment: Performed at [facility]  Ferritin     Status: Abnormal   Collection Time: 01/25/16  6:47 PM  Result Value Ref Range   Ferritin 10 (L) 11 - 307 ng/mL    Comment: Performed at [facility]  Reticulocytes     Status: None   Collection Time: 01/25/16  6:47 PM  Result Value Ref Range   Retic Ct Pct 1.1 0.4 - 3.1 %   RBC. 4.53 3.87 - 5.11 MIL/uL   Retic Count, Manual 49.8 19.0 - 186.0 K/uL    Comment: Performed at [facility]  Hemoglobin A1c     Status: None   Collection Time: 01/26/16  4:51 AM  Result Value Ref Range   Hgb A1c MFr Bld 5.6 4.8 - 5.6 %    Comment: (NOTE)         Pre-diabetes: 5.7 - 6.4         Diabetes: >6.4         Glycemic control for adults with diabetes: <7.0    Mean Plasma Glucose 114 mg/dL    Comment: (NOTE) Performed At: [facility] [place], [place] [id number] Lindon Romp MD [id number] Performed at [facility]   Lipid panel     Status: None   Collection Time: 01/26/16  6:51 AM  Result Value Ref Range   Cholesterol 169 0 - 200 mg/dL   Triglycerides 96 <150 mg/dL   HDL 68 >40 mg/dL   Total CHOL/HDL Ratio 2.5 RATIO   VLDL 19 0 - 40 mg/dL   LDL Cholesterol 82 0 - 99 mg/dL    Comment:        Total Cholesterol/HDL:CHD Risk Coronary Heart Disease Risk Table                     Men   Women  1/2 Average Risk   3.4   3.3  Average Risk       5.0   4.4  2 X Average Risk   9.6   7.1   3 X Average Risk  23.4   11.0        Use the calculated Patient Ratio above and the CHD Risk Table to determine the patient's CHD Risk.        ATP III CLASSIFICATION (LDL):  <100     mg/dL   Optimal  100-129  mg/dL   Near or Above                    Optimal  130-159  mg/dL   Borderline  160-189  mg/dL   High  >190     mg/dL   Very High Performed at [facility]   TSH     Status: None   Collection Time: 01/26/16  6:51 AM  Result Value Ref Range   TSH 1.667 0.350 - 4.500 uIU/mL    Comment: Performed at [facility]  Prolactin     Status: Abnormal   Collection Time: 01/26/16  6:51 AM  Result Value Ref Range   Prolactin 50.2 (H) 4.8 - 23.3 ng/mL    Comment: (NOTE) Performed At: [facility] [address] [place], [place] [id number] Lindon Romp MD [id number] Performed at [facility]  Physical Findings: AIMS: Facial and Oral Movements Muscles of Facial Expression: None, normal Lips and Perioral Area: None, normal Jaw: None, normal Tongue: None, normal,Extremity Movements Upper (arms, wrists, hands, fingers): None, normal Lower (legs, knees, ankles, toes): None, normal, Trunk Movements Neck, shoulders, hips: None, normal, Overall Severity Severity of abnormal movements (highest score from questions above): None, normal Incapacitation due to abnormal movements: None, normal Patient's awareness of abnormal movements (rate only patient's report): No Awareness, Dental Status Current problems with teeth and/or dentures?: No Does patient usually wear dentures?: No  CIWA:  CIWA-Ar Total: 0 COWS:  COWS Total Score: 5  Musculoskeletal: Strength & Muscle Tone: within normal limits Gait & Station: normal Patient leans: N/A  Psychiatric Specialty Exam: Review of Systems  Constitutional: Negative.   HENT: Negative.   Eyes: Negative.   Cardiovascular: Negative.   Gastrointestinal: Negative.   Musculoskeletal:  Negative.   Skin: Negative.   Neurological: Negative.   Psychiatric/Behavioral: Negative for suicidal ideas and hallucinations. Depression: stable. The patient has insomnia. Nervous/anxious: stable.   All other systems reviewed and are negative.   Denies headache, denies chest pain, no SOB, no vomiting   Blood pressure 131/86, pulse 107, temperature 98 F (36.7 C), temperature source Oral, resp. rate 20, height 5\' 5"  (1.651 m), weight 77.111 kg (170 lb), last menstrual period 11/14/2015.Body mass index is 28.29 kg/(m^2).  General Appearance: Casual and . Pleasant, clam and cooperative   Eye Contact::  Good  Speech:  Normal Rate  Volume:  Normal  Mood:  states she is feeling better, minimizes depression at this time  Affect:  Congruent,   Thought Process:  Linear  Orientation:  Full (Time, Place, and Person)  Thought Content:  denies hallucinations, no delusions   Suicidal Thoughts:  No denies any suicidal or self injurious ideations, denies any homicidal ideations   Homicidal Thoughts:  No  Memory:  recent and remote grossly intact   Judgement:   Improving   Insight:  Present  Psychomotor Activity:  Normal  Concentration:  Good  Recall:  Good  Fund of Knowledge:Negative  Language: Good  Akathisia:  Yes  Handed:  Right  AIMS (if indicated):     Assets:  Communication Skills Desire for Improvement Resilience  ADL's:  Intact  Cognition: WNL  Sleep:  Number of Hours: 5.75     I agree with current treatment plan on 01/27/2016, Patient seen face-to-face for psychiatric evaluation follow-up, chart reviewed. Reviewed the information documented and agree with the treatment plan.  Treatment Plan Summary: Daily contact with patient to assess and evaluate symptoms and progress in treatment, Medication management, Plan inpatient admission and medications as below   Encourage ongoing milieu , group participation to work on coping skills and symptom reduction Patient encouraged to  abstain from alcohol completely , and to continue working on recovery efforts Encouraged to follow up with PCP regarding anemia management and follow up Continue Ativan detox protocol for alcohol WDL prevention/management Continue Latuda 40 mgrs QDAY for  Mood disorder  Increased Trazodone50 mg to 100mg s QHS for insomnia as needed  Initiate Iron supplementation  Derrill Center FNP-BC 01/27/2016, 11:06 AM  I agree with assessment and plan Geralyn Flash A. Sabra Heck, M.D.

## 2016-01-27 NOTE — BHH Group Notes (Signed)
[facility] LCSW Group Therapy  01/27/2016  11 - 11:45 AM  Type of Therapy:  Group Therapy  Participation Level: Active  Affect:  Appropriate  Cognitive: Appropriate  Insight:  Engaged  Engagement in Therapy:  Active  Modes of Intervention:  Activity, Discussion, Education, Exploration, Rapport Building, Socialization and Support  Summary of Progress/Problems: The main focus of today's process group was to discuss what "self-sabotage" means and use Motivational Interviewing to discuss what benefits, negative or positive, were involved in a self-identified self-sabotaging behavior. We then talked about reasons they may want to change the behavior and their current desire to change. Patients participated in a brief relaxation exercise and then processed the experience. Patient engaged easily and processed her tendency to self sabotage in addition to processing mindfulness exercise. Patient displayed sense of humor and readiness for change.    Sheilah Pigeon, LCSW

## 2016-01-27 NOTE — Progress Notes (Signed)
DAR NOTE: Pt present with calm mood and bright affect in the unit today. Pt have been observed in the milieu interacting with both peers and staff. Pt has been cooperative with her care, denied physical pain. As per self inventory, pt had fair sleep, good appetite, low energy and poor concentration. Pt rates depression at 0, hopelessness at 0 and anxiety at 4. Pt goal for today is " taking medications, going home." Pt's safety ensured with 15 minute and environmental checks. Pt currently denies SI/HI and A/V hallucinations. Pt verbally agrees to seek staff if SI/HI or A/VH occurs and to consult with staff before acting on these thoughts. Will continue POC.

## 2016-01-27 NOTE — Progress Notes (Signed)
Adult Psychoeducational Group Note  Date:  01/27/2016 Time:  9:07 PM  Group Topic/Focus:  Wrap-Up Group:   The focus of this group is to help patients review their daily goal of treatment and discuss progress on daily workbooks.  Participation Level:  Active  Participation Quality:  Appropriate and Attentive  Affect:  Appropriate  Cognitive:  Appropriate  Insight: Appropriate  Engagement in Group:  Engaged  Modes of Intervention:  Discussion  Additional Comments:  Pt stated she is glad her meds are working right. Pt stated tomorrow she wants to work on getting discharged on the same meds she is currently on and to take them like she is supposed to when she gets home.  Clint Bolder 01/27/2016, 9:07 PM

## 2016-01-28 DIAGNOSIS — F3181 Bipolar II disorder: Secondary | ICD-10-CM | POA: Diagnosis present

## 2016-01-28 MED ORDER — FERROUS SULFATE 325 (65 FE) MG PO TABS: 325.0000 mg | ORAL_TABLET | Freq: Every day | ORAL | Status: DC

## 2016-01-28 MED ORDER — TRAZODONE HCL 100 MG PO TABS: 100.0000 mg | ORAL_TABLET | Freq: Every evening | ORAL | Status: DC | PRN

## 2016-01-28 MED ORDER — LURASIDONE HCL 40 MG PO TABS: 40.0000 mg | ORAL_TABLET | Freq: Every day | ORAL | Status: DC

## 2016-01-28 NOTE — Progress Notes (Addendum)
  [facility] Adult Case Management Discharge Plan :  Will you be returning to the same living situation after discharge:  Yes,  with wife At discharge, do you have transportation home?: Yes,  wife Do you have the ability to pay for your medications: Yes,  discussed with weekday CSW  Release of information consent forms completed and in the chart;  Patient's signature needed at discharge.  Patient to Follow up at: Follow-up Information    Follow up with [facility] On 02/01/2016.   Why:  at 12:00pm for your initial assessment for outpatient therapy and medication management.   Contact information:   [address] [place] [address] Phone : [phone] Fax : [phone]      Next level of care provider has access to [place] and Suicide Prevention discussed: Yes,  completed with patient and her wife  Have you used any form of tobacco in the last 30 days? (Cigarettes, Smokeless Tobacco, Cigars, and/or Pipes): Yes  Has patient been referred to the Quitline?: Patient refused referral  Patient has been referred for addiction treatment: Yes  Lysle Dingwall 01/28/2016, 12:46 PM 2

## 2016-01-28 NOTE — B[facility] Group Notes (Signed)
[facility] Group Notes:  (Nursing/MHT/Case Management/Adjunct)  Date:  01/28/2016  Time:  11:01 AM   Type of Therapy:  Psychoeducational Skills  Participation Level:  Active  Participation Quality:  Appropriate  Affect:  Appropriate  Cognitive:  Appropriate  Insight:  Appropriate  Engagement in Group:  Engaged  Modes of Intervention:  Problem-solving  Summary of Progress/Problems: Summary of Progress/Problems: Topic was on healthy support system. Discussed the important of choosing healthy leisure activities. Group encouraged to surround themselves with positive and healthy group/support system when changing to a healthy life style.   Samantha Howard 01/28/2016, 11:01 AM

## 2016-01-28 NOTE — Discharge Summary (Signed)
Physician Discharge Summary Note  Patient:  Samantha Howard is an 35 y.o., female MRN:  [id number] DOB:  1981-12-25 Patient phone:  [phone] (home)  Patient address:   [place] [address],  Total Time spent with patient: 30 minutes  Date of Admission:  01/24/2016 Date of Discharge: 01/28/2016  Reason for Admission:PER HPI-Samantha Howard, 35 year old female, lives with spouse and three children ( 20, 37, 7 ). Employed, but had been taking time off work due to hysterectomy 12/27/15. She reported that over recent weeke, she was bored and depression seem to worsen. Patient reported some neuro-vegetative symptoms of depression, mainly decreased sense of self esteem, low energy, poor sleep and appetite, denies anhedonia. She reports that her drinking also worsened and would drink several times a week. Patient states that before she overdosed on Ibuprofen and Tramadol, which had been prescribed to her from her surgery. She denied having suicidal ideations before the overdose. She states she took about " ten pills" and then told her spouse who her brought to hospital.   She states that, " I really did not want to die, I just wanted help, I feel like I am killing myself slowly with alcohol" and that she only wanted to get some help to get off alcohol and start psychiatric medications that help". Patient endorses having been diagnosed with Bipolar Disorder in the past, and states she has a long history of insomnia. As above, also endorses history of Alcohol Abuse, which has been progressing in severity. She states she has not been on any psychiatric medications in more than one year. Her BAL 261 on admission   Principal Problem: Alcohol dependence with alcohol-induced mood disorder [facility]) Discharge Diagnoses: Patient Active Problem List   Diagnosis Date Noted  . Alcohol dependence with alcohol-induced mood disorder ([place]) [F10.24]   . Major depressive disorder,  recurrent, severe without psychotic features ([place]) [F33.2] 01/24/2016  . Alcohol abuse [F10.10] 01/19/2016  . MDD (major depressive disorder), recurrent severe, without psychosis ([place]) [F33.2] 01/19/2016  . Bipolar 1 disorder, mixed, severe ([place]) [F31.63] 01/19/2016  . Postoperative state [Z98.890] 12/27/2015    Past Psychiatric History; see above  Past Medical History:  Past Medical History  Diagnosis Date  . Bipolar 1 disorder ([place])   . Seizures ([place])     Pt reports none in the last 10 years  . Depression   . Anemia   . Alcohol use [facility])     Past Surgical History  Procedure Laterality Date  . No past surgeries    . Robotic assisted total hysterectomy N/A 12/27/2015    Procedure: ROBOTIC ASSISTED TOTAL HYSTERECTOMY;  Surgeon: Samantha Charleston, MD;  Location: [facility];  Service: Gynecology;  Laterality: N/A;  . Bilateral salpingectomy Bilateral 12/27/2015    Procedure: BILATERAL SALPINGECTOMY;  Surgeon: Samantha Charleston, MD;  Location: [facility];  Service: Gynecology;  Laterality: Bilateral;  . Cystoscopy N/A 12/27/2015    Procedure: CYSTOSCOPY;  Surgeon: Samantha Charleston, MD;  Location: [facility];  Service: Gynecology;  Laterality: N/A;   Family History: History reviewed. No pertinent family history. Family Psychiatric  History: unknown Social History:  History  Alcohol Use  . 3.6 oz/week  . 6 Cans of beer per week    Comment: socially     History  Drug Use No    Social History   Social History  . Marital Status: Married    Spouse Name: N/A  . Number of Children: N/A  . Years of Education: N/A  Social History Main Topics  . Smoking status: Current Every Day Smoker -- 1.00 packs/day    Types: Cigars  . Smokeless tobacco: Never Used  . Alcohol Use: 3.6 oz/week    6 Cans of beer per week     Comment: socially  . Drug Use: No  . Sexual Activity: Not Asked   Other Topics Concern  . None   Social History Narrative    Hospital Course:  Samantha Howard was  admitted for Alcohol dependence with alcohol-induced mood disorder ([place]) ,  and crisis management.  Pt was treated discharged with the medications listed below under Medication List.  Medical problems were identified and treated as needed.  Home medications were restarted as appropriate.  Improvement was monitored by observation and Samantha Howard 's daily report of symptom reduction.  Emotional and mental status was monitored by daily self-inventory reports completed by Samantha Howard and clinical staff.         Samantha Howard was evaluated by the treatment team for stability and plans for continued recovery upon discharge. Samantha Howard 's motivation was an integral factor for scheduling further treatment. Employment, transportation, bed availability, health status, family support, and any pending legal issues were also considered during hospital stay. Pt was offered further treatment options upon discharge including but not limited to Residential, Intensive Outpatient, and Outpatient treatment.  Samantha Howard will follow up with the services as listed below under Follow Up Information.     Upon completion of this admission the patient was both mentally and medically stable for discharge denying suicidal/homicidal ideation, auditory/visual/tactile hallucinations, delusional thoughts and paranoia.      Samantha Howard responded well to treatment with Lutuda 40mg , and  Trazodone 100mg  without adverse effects. Pt demonstrated improvement without reported or observed adverse effects to the point of stability appropriate for outpatient management. Pertinent labs include: Iron and TIBC 23 (l) , Ferritin 10(l) , , Prolactin 50.2 (high),  for which outpatient follow-up is necessary for lab recheck as mentioned below. Reviewed CBC, CMP, BAL 261on admission, and UDS; all unremarkable aside from noted exceptions.   Physical Findings: AIMS: Facial and Oral Movements Muscles of  Facial Expression: None, normal Lips and Perioral Area: None, normal Jaw: None, normal Tongue: None, normal,Extremity Movements Upper (arms, wrists, hands, fingers): None, normal Lower (legs, knees, ankles, toes): None, normal, Trunk Movements Neck, shoulders, hips: None, normal, Overall Severity Severity of abnormal movements (highest score from questions above): None, normal Incapacitation due to abnormal movements: None, normal Patient's awareness of abnormal movements (rate only patient's report): No Awareness, Dental Status Current problems with teeth and/or dentures?: No Does patient usually wear dentures?: No  CIWA:  CIWA-Ar Total: 0 COWS:  COWS Total Score: 5  Musculoskeletal: Strength & Muscle Tone: within normal limits Gait & Station: normal Patient leans: N/A  Psychiatric Specialty Exam: SEE SRA BY MD Review of Systems  Psychiatric/Behavioral: Negative for suicidal ideas and hallucinations. Depression: stable. Nervous/anxious: stable. Insomnia: stable.   All other systems reviewed and are negative.   Blood pressure 117/95, pulse 92, temperature 97.6 F (36.4 C), temperature source Oral, resp. rate 18, height 5\' 5"  (1.651 m), weight 77.111 kg (170 lb), last menstrual period 11/14/2015.Body mass index is 28.29 kg/(m^2).  Have you used any form of tobacco in the last 30 days? (Cigarettes, Smokeless Tobacco, Cigars, and/or Pipes): Yes  Has this patient used any form of tobacco in the last 30 days? (Cigarettes, Smokeless Tobacco, Cigars, and/or Pipes)  Yes, A prescription for an FDA-approved  tobacco cessation medication was offered at discharge and the patient refused  Metabolic Disorder Labs:  Lab Results  Component Value Date   HGBA1C 5.6 01/26/2016   MPG 114 01/26/2016   Lab Results  Component Value Date   PROLACTIN 50.2* 01/26/2016   Lab Results  Component Value Date   CHOL 169 01/26/2016   TRIG 96 01/26/2016   HDL 68 01/26/2016   CHOLHDL 2.5 01/26/2016   VLDL  19 01/26/2016   LDLCALC 82 01/26/2016    See Psychiatric Specialty Exam and Suicide Risk Assessment completed by Attending Physician prior to discharge.  Discharge destination:  Home  Is patient on multiple antipsychotic therapies at discharge:  No   Has Patient had three or more failed trials of antipsychotic monotherapy by history:  No  Recommended Plan for Multiple Antipsychotic Therapies: NA  Discharge Instructions    Activity as tolerated - No restrictions    Complete by:  As directed      Diet general    Complete by:  As directed      Discharge instructions    Complete by:  As directed   Take all medications as prescribed. Keep all follow-up appointments as scheduled.  Do not consume alcohol or use illegal drugs while on prescription medications. Report any adverse effects from your medications to your primary care provider promptly.  In the event of recurrent symptoms or worsening symptoms, call 911, a crisis hotline, or go to the nearest emergency department for evaluation.            Medication List    STOP taking these medications        EPINEPHrine 0.3 mg/0.3 mL Soaj injection  Commonly known as:  EPI-PEN     ibuprofen 800 MG tablet  Commonly known as:  ADVIL,MOTRIN      TAKE these medications      Indication   ferrous sulfate 325 (65 FE) MG tablet  Take 1 tablet (325 mg total) by mouth daily with breakfast.   Indication:  Iron Deficiency     lurasidone 40 MG Tabs tablet  Commonly known as:  LATUDA  Take 1 tablet (40 mg total) by mouth daily with breakfast.   Indication:  mood stabliization     traZODone 100 MG tablet  Commonly known as:  DESYREL  Take 1 tablet (100 mg total) by mouth at bedtime as needed for sleep.   Indication:  Trouble Sleeping           Follow-up Information    Follow up with [facility] On 02/01/2016.   Why:  at 12:00pm for your initial assessment for outpatient therapy and medication management.   Contact  information:   [address] [place] [address] Phone : [phone] Fax : [phone]      Follow-up recommendations:  Activity:  as tolerated Diet:  heart healthy  Comments:  Take all medications as prescribed. Keep all follow-up appointments as scheduled.  Do not consume alcohol or use illegal drugs while on prescription medications. Report any adverse effects from your medications to your primary care provider promptly.  In the event of recurrent symptoms or worsening symptoms, call 911, a crisis hotline, or go to the nearest emergency department for evaluation.   Signed: Derrill Center FNP-BC 01/28/2016, 10:03 AM  I personally assessed the patient and formulated the plan Geralyn Flash A. Sabra Heck, M.D.

## 2016-01-28 NOTE — BHH Suicide Risk Assessment (Signed)
[facility] Discharge Suicide Risk Assessment   Principal Problem: Alcohol dependence with alcohol-induced mood disorder [facility]) Discharge Diagnoses:  Patient Active Problem List   Diagnosis Date Noted  . Alcohol dependence with alcohol-induced mood disorder ([place]) [F10.24]   . Major depressive disorder, recurrent, severe without psychotic features ([place]) [F33.2] 01/24/2016  . Alcohol abuse [F10.10] 01/19/2016  . MDD (major depressive disorder), recurrent severe, without psychosis ([place]) [F33.2] 01/19/2016  . Bipolar 1 disorder, mixed, severe ([place]) [F31.63] 01/19/2016  . Postoperative state [Z98.890] 12/27/2015    Total Time spent with patient: 20 minutes  Musculoskeletal: Strength & Muscle Tone: within normal limits Gait & Station: normal Patient leans: normal  Psychiatric Specialty Exam: Review of Systems  Constitutional: Negative.   HENT: Negative.   Eyes: Negative.   Respiratory: Negative.   Cardiovascular: Negative.   Gastrointestinal: Negative.   Genitourinary: Negative.   Musculoskeletal: Negative.   Skin: Negative.   Neurological: Negative.   Endo/Heme/Allergies: Negative.   Psychiatric/Behavioral: Positive for depression and substance abuse.    Blood pressure 117/95, pulse 92, temperature 97.6 F (36.4 C), temperature source Oral, resp. rate 18, height 5\' 5"  (1.651 m), weight 77.111 kg (170 lb), last menstrual period 11/14/2015.Body mass index is 28.29 kg/(m^2).  General Appearance: Fairly Groomed  Engineer, water::  Fair  Speech:  Clear and [id number]  Volume:  Normal  Mood:  Euthymic  Affect:  Appropriate  Thought Process:  Coherent and Goal Directed  Orientation:  Full (Time, Place, and Person)  Thought Content:  plans as she moves on relapse prevention  Suicidal Thoughts:  No  Homicidal Thoughts:  No  Memory:  Immediate;   Fair Recent;   Fair Remote;   Fair  Judgement:  Fair  Insight:  Present  Psychomotor Activity:  Normal  Concentration:  Fair  Recall:  [employer] [place]  Language: Fair  Akathisia:  No  Handed:  Right  AIMS (if indicated):     Assets:  Desire for Improvement Housing Social Support Vocational/Educational  Sleep:  Number of Hours: 6.25  Cognition: WNL  ADL's:  Intact  In full contact with reality. States when she moved to this area she did not have follow up stop taking her meds and started self medicating. She is back on medications now and feels better. States she is committed to abstinence Mental Status Per Nursing Assessment::   On Admission:  NA  Demographic Factors:  NA  Loss Factors: NA  Historical Factors: NA  Risk Reduction Factors:   Sense of responsibility to family, Employed, Living with another person, especially a relative and Positive social support  Continued Clinical Symptoms:  Depression:   Comorbid alcohol abuse/dependence Alcohol/Substance Abuse/Dependencies  Cognitive Features That Contribute To Risk:  None    Suicide Risk:  Minimal: No identifiable suicidal ideation.  Patients presenting with no risk factors but with morbid ruminations; may be classified as minimal risk based on the severity of the depressive symptoms  Follow-up Information    Follow up with [facility] On 02/01/2016.   Why:  at 12:00pm for your initial assessment for outpatient therapy and medication management.   Contact information:   [address] [place] [address] Phone : [phone] Fax : [phone]      Plan Of Care/Follow-up recommendations:  Activity:  as tolerated Diet:  regular Follow up as above Jamyria Ozanich A, MD 01/28/2016, 10:25 AM

## 2016-01-28 NOTE — Progress Notes (Signed)
Pt discharged home with her wife. Pt was ambulatory, stable and appreciative at that time. All papers and prescriptions were given and valuables returned. Verbal understanding expressed. Denies SI/HI and A/VH. Pt given opportunity to express concerns and ask questions.

## 2016-05-25 ENCOUNTER — Encounter (HOSPITAL_COMMUNITY): Payer: Self-pay | Admitting: Emergency Medicine

## 2016-05-25 ENCOUNTER — Emergency Department (HOSPITAL_COMMUNITY)
Admission: EM | Admit: 2016-05-25 | Discharge: 2016-05-25 | Disposition: A | Discharge status: Home / Self Care | Payer: BLUE CROSS/BLUE SHIELD | Attending: Emergency Medicine | Admitting: Emergency Medicine

## 2016-05-25 DIAGNOSIS — F17210 Nicotine dependence, cigarettes, uncomplicated: Secondary | ICD-10-CM | POA: Insufficient documentation

## 2016-05-25 DIAGNOSIS — IMO0002 Reserved for concepts with insufficient information to code with codable children: Secondary | ICD-10-CM

## 2016-05-25 DIAGNOSIS — Z79899 Other long term (current) drug therapy: Secondary | ICD-10-CM | POA: Insufficient documentation

## 2016-05-25 DIAGNOSIS — Z91040 Latex allergy status: Secondary | ICD-10-CM | POA: Insufficient documentation

## 2016-05-25 DIAGNOSIS — F1010 Alcohol abuse, uncomplicated: Secondary | ICD-10-CM | POA: Insufficient documentation

## 2016-05-25 NOTE — Discharge Instructions (Signed)
Alcohol Use Disorder °Alcohol use disorder is a mental disorder. It is not a one-time incident of heavy drinking. Alcohol use disorder is the excessive and uncontrollable use of alcohol over time that leads to problems with functioning in one or more areas of daily living. People with this disorder risk harming themselves and others when they drink to excess. Alcohol use disorder also can cause other mental disorders, such as mood and anxiety disorders, and serious physical problems. People with alcohol use disorder often misuse other drugs.  °Alcohol use disorder is common and widespread. Some people with this disorder drink alcohol to cope with or escape from negative life events. Others drink to relieve chronic pain or symptoms of mental illness. People with a family history of alcohol use disorder are at higher risk of losing control and using alcohol to excess.  °Drinking too much alcohol can cause injury, accidents, and health problems. One drink can be too much when you are: °· Working. °· Pregnant or breastfeeding. °· Taking medicines. Ask your doctor. °· Driving or planning to drive. °SYMPTOMS  °Signs and symptoms of alcohol use disorder may include the following:  °· Consumption of alcohol in larger amounts or over a longer period of time than intended. °· Multiple unsuccessful attempts to cut down or control alcohol use.   °· A great deal of time spent obtaining alcohol, using alcohol, or recovering from the effects of alcohol (hangover). °· A strong desire or urge to use alcohol (cravings).   °· Continued use of alcohol despite problems at work, school, or home because of alcohol use.   °· Continued use of alcohol despite problems in relationships because of alcohol use. °· Continued use of alcohol in situations when it is physically hazardous, such as driving a car. °· Continued use of alcohol despite awareness of a physical or psychological problem that is likely related to alcohol use. Physical  problems related to alcohol use can involve the brain, heart, liver, stomach, and intestines. Psychological problems related to alcohol use include intoxication, depression, anxiety, psychosis, delirium, and dementia.   °· The need for increased amounts of alcohol to achieve the same desired effect, or a decreased effect from the consumption of the same amount of alcohol (tolerance). °· Withdrawal symptoms upon reducing or stopping alcohol use, or alcohol use to reduce or avoid withdrawal symptoms. Withdrawal symptoms include: °¨ Racing heart. °¨ Hand tremor. °¨ Difficulty sleeping. °¨ Nausea. °¨ Vomiting. °¨ Hallucinations. °¨ Restlessness. °¨ Seizures. °DIAGNOSIS °Alcohol use disorder is diagnosed through an assessment by your health care provider. Your health care provider may start by asking three or four questions to screen for excessive or problematic alcohol use. To confirm a diagnosis of alcohol use disorder, at least two symptoms must be present within a 12-month period. The severity of alcohol use disorder depends on the number of symptoms: °· Mild--two or three. °· Moderate--four or five. °· Severe--six or more. °Your health care provider may perform a physical exam or use results from lab tests to see if you have physical problems resulting from alcohol use. Your health care provider may refer you to a mental health professional for evaluation. °TREATMENT  °Some people with alcohol use disorder are able to reduce their alcohol use to low-risk levels. Some people with alcohol use disorder need to quit drinking alcohol. When necessary, mental health professionals with specialized training in substance use treatment can help. Your health care provider can help you decide how severe your alcohol use disorder is and what type of treatment you need.   The following forms of treatment are available:   Detoxification. Detoxification involves the use of prescription medicines to prevent alcohol withdrawal  symptoms in the first week after quitting. This is important for people with a history of symptoms of withdrawal and for heavy drinkers who are likely to have withdrawal symptoms. Alcohol withdrawal can be dangerous and, in severe cases, cause death. Detoxification is usually provided in a hospital or in-patient substance use treatment facility.  Counseling or talk therapy. Talk therapy is provided by substance use treatment counselors. It addresses the reasons people use alcohol and ways to keep them from drinking again. The goals of talk therapy are to help people with alcohol use disorder find healthy activities and ways to cope with life stress, to identify and avoid triggers for alcohol use, and to handle cravings, which can cause relapse.  Medicines.Different medicines can help treat alcohol use disorder through the following actions:  Decrease alcohol cravings.  Decrease the positive reward response felt from alcohol use.  Produce an uncomfortable physical reaction when alcohol is used (aversion therapy).  Support groups. Support groups are run by people who have quit drinking. They provide emotional support, advice, and guidance. These forms of treatment are often combined. Some people with alcohol use disorder benefit from intensive combination treatment provided by specialized substance use treatment centers. Both inpatient and outpatient treatment programs are available.   This information is not intended to replace advice given to you by your health care provider. Make sure you discuss any questions you have with your health care provider.   Document Released: 01/23/2005 Document Revised: 01/06/2015 Document Reviewed: 03/25/2013 Elsevier Interactive Patient Education 2016 [facility]/Substance Abuse Adult The United Ways 211 is a great source of information about community services available.  Access by dialing 2-1-1 from  anywhere in [place], or by website -  CustodianSupply.fi.   Other Local Resources (Updated 12/2015)  [employer]  Crisis Hotline, available 24 hours a day, 7 days a week: [place], [place]   [facility]  Crisis Hotline, available 24 hours a day, 7 days a week: [place], [place]  [facility]  Suicide Prevention Hotline, available 24 hours a day, 7 days a week: [place], [place], available 24 hours a day, 7 days a week: [place], [place] Access to [employer], available 24 hours a day, 7 days a week: 515-003-9219 All   [facility]  Crisis Hotline, available 24 hours a day, 7 days a week: [phone] All   Other Local Resources (Updated 12/2015)  Outpatient Counseling/ Substance Abuse Programs  Services     Address and Phone Number  ADS (Alcohol and Drug Services)   Options include Individual counseling, group counseling, intensive outpatient program (several hours a day, several days a week)  Offers depression assessments  Provides methadone maintenance program [phone] [address], [place], [place] partial hospitalization/day treatment and DUI/DWI programs  [employer], private insurance [phone] [phone] [place], [place] [address]  [place] include intensive outpatient program (several hours a day, several days a week), outpatient treatment, DUI/DWI services, family education  Also has some services specifically for [employer] transitional housing  [phone] [address] [employer]  [place], [place] [address]     [place] Medicare, private pay, and private insurance [phone] [address]  [place], [place], [place] [address]  [facility]  Services include individual counseling, substance abuse intensive outpatient program (several hours a day, several days a week), day treatment  Blinda Leatherwood, Medicaid, private insurance [phone] [address], [place], [place] [address]  [facility]   Offers substance abuse intensive outpatient program (several hours a day, several days a week), partial hospitalization program [phone] [address] [place], [place] [address]  [phone] [address] [place], [place] [address]  [phone] [place], [place] [address]  [phone], [place], [place] [address]  [facility]  Individual counseling only  Accepts private insurance only [phone] [address] [place], [place] [address]  [facility]: Methadone Clinic  Methadone maintenance program [phone] [address] [place], [place] [address]  [facility] providing substance abuse and mental health counseling  Accepts Medicaid, Medicare, private insurance  Offers sliding scale for uninsured [phone] [place], [place] in [place] individual counseling, and intensive in-home services [phone] [address] [place], [place] [address]  [facility] [place] individual counseling, family counseling, group therapy, domestic violence counseling, consumer credit counseling  Accepts Medicare, Medicaid, private insurance  Offers sliding scale for uninsured [phone] [address] [place], [place] [address]  [phone] [facility], [address] [place], [place]  [facility]  Offers individual, family and group counseling  3 locations - [place], [place], and [place]  [place] [address]  [place], [place] [address]  [address] [place], [place] [address]  [place], [place] [address]  [facility] [place] [facility]    Offers psychiatric assessment, 8-week Intensive Outpatient Program (several hours a day, several times a week, daytime or evenings), early recovery group, family Program, medication management  Private pay or private insurance only [phone], or  [phone] [address] [place], [place] [address]  [facility]  Offers individual, couples and family counseling  Accepts Medicaid, private insurance, and sliding scale for uninsured [phone] [address] [place], [place] [address]  Launa Flight, MD  Individual counseling  Private insurance [phone] [place], [place] [address]  [facility]   Offers assessment, substance abuse treatment, and behavioral health treatment [phone] [address]. [place], [place] [address]  [place]  Individual counseling  Accepts private insurance [phone] [place], [place] [address]  [facility]  Individual counseling  Accepts Medicare, private insurance [phone] [place], [place] [address]  [place]    Offers intensive outpatient program (several hours a day, several times a week)  Private pay, private insurance [place], [place]  Individual counseling  Medicare, private insurance [phone] [address] [place], [place] [address]  [facility]    Offers intensive outpatient program (several hours a day, several times a week) and partial hospitalization program [phone] [place], [place] [address]  Letta Moynahan, MD  Individual counseling [phone] [address], [place],   [address]  [facility]  Offers Christian counseling to individuals, couples, and families  Accepts Medicare and private insurance; offers sliding scale for uninsured [phone] [place], [place] [address]  [facility]  Christian counseling [phone] [address], [place], [place] [address]  [facility] [employer] crisis counseling, individual counseling, group therapy, in-home therapy, domestic violence services, day treatment, DWI services, Conservation officer, nature (CST), Actuary (ACTT), substance abuse Intensive Outpatient Program (several hours a day, several times a week)  2 locations - [place] and [place], [place] [address]  [phone] [address] [place], [place] [address]  [place] counseling and group therapy  [employer], [place], [place] [phone] [address] [place], [place]  [facility]  Offers individual and family counseling  Offers LGBTQ services  Accepts private insurance and private pay [phone] [place], [place] [address]  [facility]    Offers individual counseling, group therapy, and outpatient detox  Accepts private insurance [phone] [place], [place] Medicare, private insurance [phone] [address], [place], [place] [address]  [employer]  Individual counseling  Accepts Medicare, private insurance [phone] [address] [place], [place] [address]  [facility] substance abuse Intensive Outpatient Program (several hours a day, several times a week) [phone], or [phone] [place], [place]  Health/Residential  Substance Abuse Treatment Adults The United Ways 211 is a great source of information about community services available.  Access by dialing 2-1-1 from anywhere in [place], or by website -  CustodianSupply.fi.   (Updated 12/2015)  Crisis Assistance 24 hours a day   [place]  24-hour crisis assistance: [place], [place]   [facility]  24-hour crisis [place], [place]   24-hour crisis assistance: [place], [place] Access to Care Line  24-hour crisis assistance; [phone] All   [facility]  24-hour crisis response line: [phone] All   Other Local Resources (Updated 12/2015)  Inpatient Behavioral Health/Residential Substance Abuse Treatment Programs   Services      Address and Phone Number  [place] ([place])   14-day residential rehabilitation  [phone] [address] [place], [place] ([place])    Detox - private pay only  14-day residential rehabilitation -  Medicaid, insurance, private pay only [phone], or [place], [place], [place] [address]   [place] only  Multiple facilities [phone] admissions   [facility] ([place])   90-day program  Must be homeless to participate  [phone], or [place], [place] only [phone], or  [place], [place] [address]  [place]     Must make an appointment  Transportation is offered from [place] on [employer].  Accepts private pay, [place]vin So [facility] [phone]  [place] [address] [place], [place] [address]   [employer]  Females  only  Associated with the [place] [phone] [place],  [address]  [facility]   Private insurance only [phone], or [place], [place]    Detox  Residential rehabilitation  Private insurance only  Multiple locations [phone] admissions  [place] of [facility] pay  Private insurance [phone] [address] [place] [address]  [facility]    Males only  Fee required at time of admission [place], [place] [address]  [facility] [place] pay only  [phone] [address] [address] [place], [place]  RTS (Residential Treatment Services)    Detox - private pay, Medicaid  Residential rehabilitation for males  - Medicare, Medicaid, insurance, private pay [phone] [place], [place] interviews Monday - Saturday from 8 am - 4 pm  Individuals with legal charges are not eligible [phone] [address] [place], [place] [address]  [facility]   Must be willing to work  Must attend Alcoholics Anonymous meetings [phone] [address] [place], [place]    Faith-based program  Private pay only [phone] [place], [place]

## 2016-05-25 NOTE — ED Notes (Signed)
Patient presents for alcohol abuse. Denies SI/HI/AVH.

## 2016-05-25 NOTE — ED Provider Notes (Signed)
CSN: [id number]     Arrival date & time 05/25/16  2142 History  By signing my name below, I, Soijett Blue, attest that this documentation has been prepared under the direction and in the presence of Antonietta Breach, PA-C Electronically Signed: Soijett Blue, ED Scribe. 05/25/2016. 10:25 PM.   No chief complaint on file.   The history is provided by the patient and the spouse. No language interpreter was used.   HPI Comments: Samantha Howard is a 35 y.o. female with a PMHx of alcohol abuse, bipolar 1 disorder, depression, who presents to the Emergency Department complaining of medical clearance onset tonight PTA. Pt wife reports that the pt has been drinking the past couple days and that she has and ETOH problems and wants help. Pt denies drinking to cope with her issues but notes that she drinks because she has money to buy alcohol. Pt notes that she drinks 4 24 oz beers daily. Pt denies illegal drug use. Denies being in a detox facility recently, but has been in [facility] facility years ago. Pt denies wanting to speak with someone tonight regarding her drinking, she states "I'm fine, I just want to go to sleep." Pt denies being on medications for her bipolar and depression and she has been off it since her last visit to the ED. She states that she has not tried any medications for the relief for her symptoms. She denies HI, SI, suicide attempt, and any other symptoms.   Past Medical History  Diagnosis Date  . Bipolar 1 disorder ([place])   . Seizures ([place])     Pt reports none in the last 10 years  . Depression   . Anemia   . Alcohol use [facility])    Past Surgical History  Procedure Laterality Date  . No past surgeries    . Robotic assisted total hysterectomy N/A 12/27/2015    Procedure: ROBOTIC ASSISTED TOTAL HYSTERECTOMY;  Surgeon: Bobbye Charleston, MD;  Location: [facility];  Service: Gynecology;  Laterality: N/A;  . Bilateral salpingectomy Bilateral 12/27/2015    Procedure: BILATERAL  SALPINGECTOMY;  Surgeon: Bobbye Charleston, MD;  Location: [facility];  Service: Gynecology;  Laterality: Bilateral;  . Cystoscopy N/A 12/27/2015    Procedure: CYSTOSCOPY;  Surgeon: Bobbye Charleston, MD;  Location: [facility];  Service: Gynecology;  Laterality: N/A;   No family history on file. Social History  Substance Use Topics  . Smoking status: Current Every Day Smoker -- 1.00 packs/day    Types: Cigars  . Smokeless tobacco: Never Used  . Alcohol Use: 3.6 oz/week    6 Cans of beer per week     Comment: socially   OB History    Gravida Para Term Preterm AB TAB SAB Ectopic Multiple Living   0               Review of Systems  Skin: Negative for wound.  Psychiatric/Behavioral: Negative for suicidal ideas and self-injury.       No HI  All other systems reviewed and are negative.   Allergies  Latex  Home Medications   Prior to Admission medications   Medication Sig Start Date End Date Taking? Authorizing Provider  ferrous sulfate 325 (65 FE) MG tablet Take 1 tablet (325 mg total) by mouth daily with breakfast. 01/28/16   Derrill Center, NP  lurasidone (LATUDA) 40 MG TABS tablet Take 1 tablet (40 mg total) by mouth daily with breakfast. 01/28/16   Derrill Center, NP  traZODone (DESYREL) 100 MG  tablet Take 1 tablet (100 mg total) by mouth at bedtime as needed for sleep. 01/28/16   Derrill Center, NP   BP 126/96 mmHg  Pulse 103  Temp(Src) 98.2 F (36.8 C) (Oral)  Resp 18  SpO2 98%  LMP 11/14/2015 (Approximate)   Physical Exam  Constitutional: She is oriented to person, place, and time. She appears well-developed and well-nourished. No distress.  HENT:  Head: Normocephalic and atraumatic.  Eyes: Conjunctivae and EOM are normal. No scleral icterus.  Neck: Normal range of motion.  Pulmonary/Chest: Effort normal. No respiratory distress.  Musculoskeletal: Normal range of motion.  Neurological: She is alert and oriented to person, place, and time.  Skin: Skin is warm and dry. No  rash noted. She is not diaphoretic. No erythema. No pallor.  Psychiatric: She has a normal mood and affect. Her speech is normal. She is agitated. She expresses no homicidal and no suicidal ideation. She expresses no suicidal plans and no homicidal plans.  Nursing note and vitals reviewed.   ED Course  Procedures (including critical care time) DIAGNOSTIC STUDIES: Oxygen Saturation is 98% on RA, nl by my interpretation.    COORDINATION OF CARE: 10:25 PM Discussed treatment plan with pt at bedside and pt agreed to plan.  Labs Review Labs Reviewed - No data to display  Imaging Review No results found.   EKG Interpretation None      MDM   Final diagnoses:  Alcohol use disorder ([place])    35 year old female presents to the emergency department for alcohol abuse. She presents with her wife who prompted her to seek evaluation today. Patient states that she does not want to be here. She states that she wants to go home and sleep. She denies any suicidal or homicidal thoughts. She does report that she became agitated earlier today. She has been drinking alcohol. She complains of no auditory or visual hallucinations. Wife is comfortable with being provided a resource guide for outpatient management. Patient is comfortable with this plan as well. No other complaints expressed. Patient discharged in good condition.  I personally performed the services described in this documentation, which was scribed in my presence. The recorded information has been reviewed and is accurate.      Antonietta Breach, PA-C 05/26/16 [id number]  Leo Grosser, MD 05/27/16 [phone]

## 2016-07-12 ENCOUNTER — Encounter (HOSPITAL_COMMUNITY): Payer: Self-pay | Admitting: Emergency Medicine

## 2016-07-12 ENCOUNTER — Ambulatory Visit (HOSPITAL_COMMUNITY)
Admission: EM | Admit: 2016-07-12 | Discharge: 2016-07-12 | Disposition: A | Discharge status: Home / Self Care | Payer: BLUE CROSS/BLUE SHIELD | Attending: Internal Medicine | Admitting: Internal Medicine

## 2016-07-12 DIAGNOSIS — M5489 Other dorsalgia: Secondary | ICD-10-CM

## 2016-07-12 DIAGNOSIS — R197 Diarrhea, unspecified: Secondary | ICD-10-CM | POA: Diagnosis not present

## 2016-07-12 LAB — POCT URINALYSIS DIP (DEVICE)
Bilirubin Urine: NEGATIVE
Glucose, UA: NEGATIVE mg/dL
Ketones, ur: NEGATIVE mg/dL
Leukocytes, UA: NEGATIVE
NITRITE: NEGATIVE
PH: 5.5 (ref 5.0–8.0)
PROTEIN: NEGATIVE mg/dL
Specific Gravity, Urine: >=1.03 (ref 1.005–1.030)
Urobilinogen, UA: 0.2 mg/dL (ref 0.0–1.0)

## 2016-07-12 MED ORDER — IBUPROFEN 800 MG PO TABS: 800.0000 mg | ORAL_TABLET | Freq: Three times a day (TID) | ORAL | Status: DC

## 2016-07-12 MED ORDER — DIPHENOXYLATE-ATROPINE 2.5-0.025 MG PO TABS: 2.0000 | ORAL_TABLET | Freq: Four times a day (QID) | ORAL | Status: DC | PRN

## 2016-07-12 NOTE — ED Notes (Signed)
Here for back pain onset x2 weeks... Denies inj/trauma... Works unloading/loading trucks  Also reports diarrhea onset x2 weeks... Has had x6 loose stools yest and x2 today  A&O x4.... NAD

## 2016-07-12 NOTE — ED Provider Notes (Signed)
CSN: [id number]     Arrival date & time 07/12/16  1306 History   First MD Initiated Contact with Patient 07/12/16 1359     Chief Complaint  Patient presents with  . Back Pain  . Diarrhea   (Consider location/radiation/quality/duration/timing/severity/associated sxs/prior Treatment) Patient is a 35 y.o. female presenting with back pain and diarrhea. The history is provided by the patient. No language interpreter was used.  Back Pain Location:  Generalized Quality:  Aching Radiates to:  Does not radiate Pain severity:  Moderate Pain is:  Same all the time Onset quality:  Gradual Duration:  2 weeks Timing:  Constant Progression:  Worsening Chronicity:  New Context: not recent injury   Relieved by:  Nothing Worsened by:  Nothing tried Ineffective treatments:  None tried Associated symptoms: abdominal pain   Risk factors: not pregnant   Diarrhea Associated symptoms: abdominal pain   Pt reports diarrhea for 2 days.  Pt has pain in low back.  Pt lifts for a living.  Past Medical History  Diagnosis Date  . Bipolar 1 disorder ([place])   . Seizures ([place])     Pt reports none in the last 10 years  . Depression   . Anemia   . Alcohol use [facility])    Past Surgical History  Procedure Laterality Date  . No past surgeries    . Robotic assisted total hysterectomy N/A 12/27/2015    Procedure: ROBOTIC ASSISTED TOTAL HYSTERECTOMY;  Surgeon: Bobbye Charleston, MD;  Location: [facility];  Service: Gynecology;  Laterality: N/A;  . Bilateral salpingectomy Bilateral 12/27/2015    Procedure: BILATERAL SALPINGECTOMY;  Surgeon: Bobbye Charleston, MD;  Location: [facility];  Service: Gynecology;  Laterality: Bilateral;  . Cystoscopy N/A 12/27/2015    Procedure: CYSTOSCOPY;  Surgeon: Bobbye Charleston, MD;  Location: [facility];  Service: Gynecology;  Laterality: N/A;   History reviewed. No pertinent family history. Social History  Substance Use Topics  . Smoking status: Current Every Day Smoker -- 1.00 packs/day     Types: Cigars  . Smokeless tobacco: Never Used  . Alcohol Use: 3.6 oz/week    6 Cans of beer per week     Comment: socially   OB History    Gravida Para Term Preterm AB TAB SAB Ectopic Multiple Living   0              Review of Systems  Gastrointestinal: Positive for abdominal pain and diarrhea.  Musculoskeletal: Positive for back pain.  All other systems reviewed and are negative.   Allergies  Latex  Home Medications   Prior to Admission medications   Medication Sig Start Date End Date Taking? Authorizing Provider  ferrous sulfate 325 (65 FE) MG tablet Take 1 tablet (325 mg total) by mouth daily with breakfast. 01/28/16   Derrill Center, NP  lurasidone (LATUDA) 40 MG TABS tablet Take 1 tablet (40 mg total) by mouth daily with breakfast. 01/28/16   Derrill Center, NP  traZODone (DESYREL) 100 MG tablet Take 1 tablet (100 mg total) by mouth at bedtime as needed for sleep. 01/28/16   Derrill Center, NP   Meds Ordered and Administered this Visit  Medications - No data to display  BP 138/88 mmHg  Pulse 98  Temp(Src) 98.7 F (37.1 C) (Oral)  Resp 20  SpO2 97%  LMP 11/14/2015 (Approximate) No data found.   Physical Exam  Constitutional: She is oriented to person, place, and time. She appears well-developed and well-nourished.  HENT:  Head: Normocephalic.  Right Ear: External ear normal.  Nose: Nose normal.  Mouth/Throat: Oropharynx is clear and moist.  Eyes: Conjunctivae and EOM are normal. Pupils are equal, round, and reactive to light.  Neck: Normal range of motion.  Cardiovascular: Normal rate.   Pulmonary/Chest: Effort normal.  Abdominal: She exhibits no distension.  Musculoskeletal: She exhibits tenderness.  Tender mid back, right greater than left    Neurological: She is alert and oriented to person, place, and time.  Skin: Skin is warm.  Psychiatric: She has a normal mood and affect.  Nursing note and vitals reviewed.   ED Course  Procedures (including  critical care time)  Labs Review Labs Reviewed  POCT URINALYSIS DIP (DEVICE) - Abnormal; Notable for the following:    Hgb urine dipstick SMALL (*)    All other components within normal limits    Imaging Review No results found.   Visual Acuity Review  Right Eye Distance:   Left Eye Distance:   Bilateral Distance:    Right Eye Near:   Left Eye Near:    Bilateral Near:         MDM   1. Diarrhea, unspecified type   2. Right-sided back pain, unspecified location    Ibuprofen Lomotil  An After Visit Summary was printed and given to the patient. Return if any problems.     Fransico Meadow, PA-C 07/12/16 Evangeline, PA-C 07/12/16 [phone]

## 2016-07-12 NOTE — Discharge Instructions (Signed)
Back Pain, Adult °Back pain is very common in adults. The cause of back pain is rarely dangerous and the pain often gets better over time. The cause of your back pain may not be known. Some common causes of back pain include: °· Strain of the muscles or ligaments supporting the spine. °· Wear and tear (degeneration) of the spinal disks. °· Arthritis. °· Direct injury to the back. °For many people, back pain may return. Since back pain is rarely dangerous, most people can learn to manage this condition on their own. °HOME CARE INSTRUCTIONS °Watch your back pain for any changes. The following actions may help to lessen any discomfort you are feeling: °· Remain active. It is stressful on your back to sit or stand in one place for long periods of time. Do not sit, drive, or stand in one place for more than 30 minutes at a time. Take short walks on even surfaces as soon as you are able. Try to increase the length of time you walk each day. °· Exercise regularly as directed by your health care provider. Exercise helps your back heal faster. It also helps avoid future injury by keeping your muscles strong and flexible. °· Do not stay in bed. Resting more than 1-2 days can delay your recovery. °· Pay attention to your body when you bend and lift. The most comfortable positions are those that put less stress on your recovering back. Always use proper lifting techniques, including: °¨ Bending your knees. °¨ Keeping the load close to your body. °¨ Avoiding twisting. °· Find a comfortable position to sleep. Use a firm mattress and lie on your side with your knees slightly bent. If you lie on your back, put a pillow under your knees. °· Avoid feeling anxious or stressed. Stress increases muscle tension and can worsen back pain. It is important to recognize when you are anxious or stressed and learn ways to manage it, such as with exercise. °· Take medicines only as directed by your health care provider. Over-the-counter  medicines to reduce pain and inflammation are often the most helpful. Your health care provider may prescribe muscle relaxant drugs. These medicines help dull your pain so you can more quickly return to your normal activities and healthy exercise. °· Apply ice to the injured area: °¨ Put ice in a plastic bag. °¨ Place a towel between your skin and the bag. °¨ Leave the ice on for 20 minutes, 2-3 times a day for the first 2-3 days. After that, ice and heat may be alternated to reduce pain and spasms. °· Maintain a healthy weight. Excess weight puts extra stress on your back and makes it difficult to maintain good posture. °SEEK MEDICAL CARE IF: °· You have pain that is not relieved with rest or medicine. °· You have increasing pain going down into the legs or buttocks. °· You have pain that does not improve in one week. °· You have night pain. °· You lose weight. °· You have a fever or chills. °SEEK IMMEDIATE MEDICAL CARE IF:  °· You develop new bowel or bladder control problems. °· You have unusual weakness or numbness in your arms or legs. °· You develop nausea or vomiting. °· You develop abdominal pain. °· You feel faint. °  °This information is not intended to replace advice given to you by your health care provider. Make sure you discuss any questions you have with your health care provider. °  °Document Released: 12/16/2005 Document Revised: 01/06/2015 Document Reviewed: 04/19/2014 °Elsevier Interactive Patient Education ©2016 Elsevier   Inc. Diarrhea Diarrhea is frequent loose and watery bowel movements. It can cause you to feel weak and dehydrated. Dehydration can cause you to become tired and thirsty, have a dry mouth, and have decreased urination that often is dark yellow. Diarrhea is a sign of another problem, most often an infection that will not last long. In most cases, diarrhea typically lasts 2-3 days. However, it can last longer if it is a sign of something more serious. It is important to treat your  diarrhea as directed by your caregiver to lessen or prevent future episodes of diarrhea. CAUSES  Some common causes include:  Gastrointestinal infections caused by viruses, bacteria, or parasites.  Food poisoning or food allergies.  Certain medicines, such as antibiotics, chemotherapy, and laxatives.  Artificial sweeteners and fructose.  Digestive disorders. HOME CARE INSTRUCTIONS  Ensure adequate fluid intake (hydration): Have 1 cup (8 oz) of fluid for each diarrhea episode. Avoid fluids that contain simple sugars or sports drinks, fruit juices, whole milk products, and sodas. Your urine should be clear or pale yellow if you are drinking enough fluids. Hydrate with an oral rehydration solution that you can purchase at pharmacies, retail stores, and online. You can prepare an oral rehydration solution at home by mixing the following ingredients together:   - tsp table salt.   tsp baking soda.   tsp salt substitute containing potassium chloride.  1  tablespoons sugar.  1 L (34 oz) of water.  Certain foods and beverages may increase the speed at which food moves through the gastrointestinal (GI) tract. These foods and beverages should be avoided and include:  Caffeinated and alcoholic beverages.  High-fiber foods, such as raw fruits and vegetables, nuts, seeds, and whole grain breads and cereals.  Foods and beverages sweetened with sugar alcohols, such as xylitol, sorbitol, and mannitol.  Some foods may be well tolerated and may help thicken stool including:  Starchy foods, such as rice, toast, pasta, low-sugar cereal, oatmeal, grits, baked potatoes, crackers, and bagels.  Bananas.  Applesauce.  Add probiotic-rich foods to help increase healthy bacteria in the GI tract, such as yogurt and fermented milk products.  Wash your hands well after each diarrhea episode.  Only take over-the-counter or prescription medicines as directed by your caregiver.  Take a warm bath to  relieve any burning or pain from frequent diarrhea episodes. SEEK IMMEDIATE MEDICAL CARE IF:   You are unable to keep fluids down.  You have persistent vomiting.  You have blood in your stool, or your stools are black and tarry.  You do not urinate in 6-8 hours, or there is only a small amount of very dark urine.  You have abdominal pain that increases or localizes.  You have weakness, dizziness, confusion, or light-headedness.  You have a severe headache.  Your diarrhea gets worse or does not get better.  You have a fever or persistent symptoms for more than 2-3 days.  You have a fever and your symptoms suddenly get worse. MAKE SURE YOU:   Understand these instructions.  Will watch your condition.  Will get help right away if you are not doing well or get worse.   This information is not intended to replace advice given to you by your health care provider. Make sure you discuss any questions you have with your health care provider.   Document Released: 12/06/2002 Document Revised: 01/06/2015 Document Reviewed: 08/23/2012 Elsevier Interactive Patient Education [employer].

## 2016-11-20 ENCOUNTER — Telehealth: Payer: Self-pay

## 2016-12-17 ENCOUNTER — Ambulatory Visit: Payer: BLUE CROSS/BLUE SHIELD | Admitting: Nurse Practitioner

## 2016-12-25 ENCOUNTER — Emergency Department (HOSPITAL_COMMUNITY)
Admission: EM | Admit: 2016-12-25 | Discharge: 2016-12-25 | Disposition: A | Discharge status: Home / Self Care | Payer: BLUE CROSS/BLUE SHIELD | Attending: Emergency Medicine | Admitting: Emergency Medicine

## 2016-12-25 ENCOUNTER — Encounter (HOSPITAL_COMMUNITY): Payer: Self-pay

## 2016-12-25 DIAGNOSIS — F17290 Nicotine dependence, other tobacco product, uncomplicated: Secondary | ICD-10-CM | POA: Diagnosis not present

## 2016-12-25 DIAGNOSIS — Z91040 Latex allergy status: Secondary | ICD-10-CM | POA: Insufficient documentation

## 2016-12-25 DIAGNOSIS — I10 Essential (primary) hypertension: Secondary | ICD-10-CM | POA: Diagnosis not present

## 2016-12-25 DIAGNOSIS — L03213 Periorbital cellulitis: Secondary | ICD-10-CM

## 2016-12-25 DIAGNOSIS — H05012 Cellulitis of left orbit: Secondary | ICD-10-CM | POA: Insufficient documentation

## 2016-12-25 DIAGNOSIS — Z79899 Other long term (current) drug therapy: Secondary | ICD-10-CM | POA: Diagnosis not present

## 2016-12-25 DIAGNOSIS — R220 Localized swelling, mass and lump, head: Secondary | ICD-10-CM | POA: Diagnosis present

## 2016-12-25 DIAGNOSIS — H00015 Hordeolum externum left lower eyelid: Secondary | ICD-10-CM | POA: Diagnosis not present

## 2016-12-25 HISTORY — DX: Essential (primary) hypertension: I10

## 2016-12-25 MED ORDER — ERYTHROMYCIN 5 MG/GM OP OINT
TOPICAL_OINTMENT | Freq: Four times a day (QID) | OPHTHALMIC | Status: DC
  Administered 2016-12-25: 1 via OPHTHALMIC
  Filled 2016-12-25: qty 3.5

## 2016-12-25 MED ORDER — AMOXICILLIN-POT CLAVULANATE 875-125 MG PO TABS
1.0000 | ORAL_TABLET | Freq: Once | ORAL | Status: AC
  Administered 2016-12-25: 1 via ORAL
  Filled 2016-12-25: qty 1

## 2016-12-25 MED ORDER — AMOXICILLIN-POT CLAVULANATE 875-125 MG PO TABS: 1.0000 | ORAL_TABLET | Freq: Two times a day (BID) | ORAL | 0 refills | Status: DC

## 2016-12-25 NOTE — ED Triage Notes (Signed)
Pt with left eye swelling and pain.  Pt started having problems 3 days ago.  Thought it was dust. Woke up to eye swelling 2 days ago with no improvement.  Using warm compresses at home.

## 2016-12-25 NOTE — Discharge Instructions (Signed)
Erythromycin ointment to the lower lid 4 times a day for 7 days. Augmentin as prescribed until all gone. Warm compresses every 1-2 hours. Follow up with ophthalmologist if not improving.

## 2016-12-25 NOTE — ED Provider Notes (Signed)
[facility] DEPT Provider Note   CSN: [id number] Arrival date & time: 12/25/16  1729   By signing my name below, I, Collene Leyden, attest that this documentation has been prepared under the direction and in the presence of Jeannett Senior, PA-C Electronically Signed: Collene Leyden, Scribe. 12/25/16. 6:00 PM.  History   Chief Complaint Chief Complaint  Patient presents with  . Facial Swelling    HPI Comments: Samantha Howard is a 35 y.o. female with a PMHx of bipolar disorder and HTN, who presents to the Emergency Department complaining of gradual onset of facial swelling of the left eye that began 3 days ago. Pt states swelling is to the lower lid only. Patient has been having associated green/yellow drainage. no visual changes. No eye redness.  Patient has been using warm compresses with no improvement. Patient states she has not had any drainage today but it has been "running" with clear liquid.   The history is provided by the patient. No language interpreter was used.    Past Medical History:  Diagnosis Date  . Alcohol use   . Anemia   . Bipolar 1 disorder ([place])   . Depression   . Hypertension   . Seizures ([place])    Pt reports none in the last 10 years    Patient Active Problem List   Diagnosis Date Noted  . Bipolar 2 disorder, major depressive episode ([place]) 01/28/2016  . Alcohol dependence with alcohol-induced mood disorder ([place])   . Major depressive disorder, recurrent, severe without psychotic features ([place]) 01/24/2016  . Alcohol abuse 01/19/2016  . MDD (major depressive disorder), recurrent severe, without psychosis ([place]) 01/19/2016  . Bipolar 1 disorder, mixed, severe ([place]) 01/19/2016  . Postoperative state 12/27/2015    Past Surgical History:  Procedure Laterality Date  . BILATERAL SALPINGECTOMY Bilateral 12/27/2015   Procedure: BILATERAL SALPINGECTOMY;  Surgeon: Bobbye Charleston, MD;  Location: [facility];  Service: Gynecology;  Laterality: Bilateral;    . CYSTOSCOPY N/A 12/27/2015   Procedure: CYSTOSCOPY;  Surgeon: Bobbye Charleston, MD;  Location: [facility];  Service: Gynecology;  Laterality: N/A;  . NO PAST SURGERIES    . ROBOTIC ASSISTED TOTAL HYSTERECTOMY N/A 12/27/2015   Procedure: ROBOTIC ASSISTED TOTAL HYSTERECTOMY;  Surgeon: Bobbye Charleston, MD;  Location: [facility];  Service: Gynecology;  Laterality: N/A;    OB History    Gravida Para Term Preterm AB Living   0             SAB TAB Ectopic Multiple Live Births                   Home Medications    Prior to Admission medications   Medication Sig Start Date End Date Taking? Authorizing Provider  diphenoxylate-atropine (LOMOTIL) 2.5-0.025 MG tablet Take 2 tablets by mouth 4 (four) times daily as needed for diarrhea or loose stools. 07/12/16   Fransico Meadow, PA-C  ferrous sulfate 325 (65 FE) MG tablet Take 1 tablet (325 mg total) by mouth daily with breakfast. 01/28/16   Derrill Center, NP  ibuprofen (ADVIL,MOTRIN) 800 MG tablet Take 1 tablet (800 mg total) by mouth 3 (three) times daily. 07/12/16   Fransico Meadow, PA-C  lurasidone (LATUDA) 40 MG TABS tablet Take 1 tablet (40 mg total) by mouth daily with breakfast. 01/28/16   Derrill Center, NP  traZODone (DESYREL) 100 MG tablet Take 1 tablet (100 mg total) by mouth at bedtime as needed for sleep. 01/28/16   Derrill Center, NP  Family History History reviewed. No pertinent family history.  Social History Social History  Substance Use Topics  . Smoking status: Current Every Day Smoker    Packs/day: 1.00    Types: Cigars  . Smokeless tobacco: Never Used  . Alcohol use 3.6 oz/week    6 Cans of beer per week     Comment: socially     Allergies   Latex   Review of Systems Review of Systems  Constitutional: Negative for chills and fever.  HENT: Positive for facial swelling.   Eyes: Positive for pain, discharge and itching. Negative for photophobia and visual disturbance.     Physical Exam Updated Vital Signs BP (!)  154/103 (BP Location: Right Arm)   Pulse 89   Temp 98.4 F (36.9 C) (Oral)   Resp 18   LMP 12/27/2015   SpO2 99%   Physical Exam  Constitutional: She appears well-developed and well-nourished. No distress.  Eyes: Conjunctivae and EOM are normal. Pupils are equal, round, and reactive to light.    Hordeolum internum to the left lower lid. There is surrounding erythema and swelling of the lower lid extending to the maxilla. Tenderness to palpation over left lower lid.   Neck: Neck supple.  Neurological: She is alert.  Skin: Skin is warm and dry.  Nursing note and vitals reviewed.    ED Treatments / Results  DIAGNOSTIC STUDIES: Oxygen Saturation is 99% on RA, normal by my interpretation.    COORDINATION OF CARE: 6:00 PM Discussed treatment plan with pt at bedside and pt agreed to plan.  Labs (all labs ordered are listed, but only abnormal results are displayed) Labs Reviewed - No data to display  EKG  EKG Interpretation None       Radiology No results found.  Procedures Procedures (including critical care time)  INCISION AND DRAINAGE Performed by: Jeannett Senior A Consent: Verbal consent obtained. Risks and benefits: risks, benefits and alternatives were discussed Type: stye- pustule  Body area: left lower lid  Anesthesia: none  Incision was made with a 23 gauge needle  Complexity: simple  Drainage: purulent  Drainage amount: small   Patient tolerance: Patient tolerated the procedure well with no immediate complications.    Medications Ordered in ED Medications - No data to display   Initial Impression / Assessment and Plan / ED Course  I have reviewed the triage vital signs and the nursing notes.  Pertinent labs & imaging results that were available during my care of the patient were reviewed by me and considered in my medical decision making (see chart for details).  Clinical Course    The patient and with the left lower lid. I with  now swelling and redness to the lower lid.concerning for early periorbital cellulitis. There is a well-formed pustule to the hordeolum which I incised with a 23-gauge needle with smallpurulent drainage out. Patient tolerated this well and feels better.We'll treat with erythromycin ointment and will put on Augmentin. First dose given in emergency department. Home with follow-up with ophthalmology, return if worsening. Patient agreed to the plan.  Vitals:   12/25/16 1740  BP: (!) 154/103  Pulse: 89  Resp: 18  Temp: 98.4 F (36.9 C)  TempSrc: Oral  SpO2: 99%     Final Clinical Impressions(s) / ED Diagnoses   Final diagnoses:  Hordeolum externum of left lower eyelid  Periorbital cellulitis of left eye    New Prescriptions New Prescriptions   AMOXICILLIN-CLAVULANATE (AUGMENTIN) 875-125 MG TABLET    Take  1 tablet by mouth every 12 (twelve) hours.   I personally performed the services described in this documentation, which was scribed in my presence. The recorded information has been reviewed and is accurate.    Jeannett Senior, PA-C 12/25/16 [place], MD 12/25/16 2042

## 2017-01-14 ENCOUNTER — Encounter (HOSPITAL_COMMUNITY): Payer: Self-pay | Admitting: Family Medicine

## 2017-01-14 ENCOUNTER — Ambulatory Visit (HOSPITAL_COMMUNITY)
Admission: EM | Admit: 2017-01-14 | Discharge: 2017-01-14 | Disposition: A | Discharge status: Home / Self Care | Payer: BLUE CROSS/BLUE SHIELD | Attending: Family Medicine | Admitting: Family Medicine

## 2017-01-14 DIAGNOSIS — K529 Noninfective gastroenteritis and colitis, unspecified: Secondary | ICD-10-CM

## 2017-01-14 DIAGNOSIS — R112 Nausea with vomiting, unspecified: Secondary | ICD-10-CM

## 2017-01-14 DIAGNOSIS — R197 Diarrhea, unspecified: Secondary | ICD-10-CM | POA: Diagnosis not present

## 2017-01-14 DIAGNOSIS — A09 Infectious gastroenteritis and colitis, unspecified: Secondary | ICD-10-CM | POA: Diagnosis not present

## 2017-01-14 MED ORDER — ONDANSETRON 4 MG PO TBDP
4.0000 mg | ORAL_TABLET | Freq: Once | ORAL | Status: AC
  Administered 2017-01-14: 4 mg via ORAL

## 2017-01-14 MED ORDER — ONDANSETRON 4 MG PO TBDP
ORAL_TABLET | ORAL | Status: AC
  Filled 2017-01-14: qty 1

## 2017-01-14 MED ORDER — ONDANSETRON HCL 4 MG PO TABS: 4.0000 mg | ORAL_TABLET | Freq: Four times a day (QID) | ORAL | 0 refills | Status: DC

## 2017-01-14 NOTE — ED Triage Notes (Signed)
Pt here for N,V,D that started last night after she ate a piece of chicken.

## 2017-01-14 NOTE — ED Provider Notes (Signed)
CSN: [id number]     Arrival date & time 01/14/17  1236 History   First MD Initiated Contact with Patient 01/14/17 1337     Chief Complaint  Patient presents with  . Diarrhea   (Consider location/radiation/quality/duration/timing/severity/associated sxs/prior Treatment) 36 year old female complaining of vomiting and diarrhea since last evening. She states she has had vomiting too numerous to count and diarrhea too numerous to count. She is sitting no bleeding.'s primarily watery. She is unsure as what may precipitated it. She does have a history of alcoholism and alcohol use but denies that at this time. She states she ate a portion of chicken just prior to the onset of symptoms. States she does have some abdominal pain which is primarily epigastric.      Past Medical History:  Diagnosis Date  . Alcohol use   . Anemia   . Bipolar 1 disorder ([place])   . Depression   . Hypertension   . Seizures ([place])    Pt reports none in the last 10 years   Past Surgical History:  Procedure Laterality Date  . BILATERAL SALPINGECTOMY Bilateral 12/27/2015   Procedure: BILATERAL SALPINGECTOMY;  Surgeon: Bobbye Charleston, MD;  Location: [facility];  Service: Gynecology;  Laterality: Bilateral;  . CYSTOSCOPY N/A 12/27/2015   Procedure: CYSTOSCOPY;  Surgeon: Bobbye Charleston, MD;  Location: [facility];  Service: Gynecology;  Laterality: N/A;  . NO PAST SURGERIES    . ROBOTIC ASSISTED TOTAL HYSTERECTOMY N/A 12/27/2015   Procedure: ROBOTIC ASSISTED TOTAL HYSTERECTOMY;  Surgeon: Bobbye Charleston, MD;  Location: [facility];  Service: Gynecology;  Laterality: N/A;   History reviewed. No pertinent family history. Social History  Substance Use Topics  . Smoking status: Current Every Day Smoker    Packs/day: 1.00    Types: Cigars  . Smokeless tobacco: Never Used  . Alcohol use 3.6 oz/week    6 Cans of beer per week     Comment: socially   OB History    Gravida Para Term Preterm AB Living   0             SAB TAB  Ectopic Multiple Live Births                 Review of Systems  Constitutional: Positive for activity change. Negative for fever.  HENT: Negative.   Respiratory: Negative.   Cardiovascular: Negative for chest pain.  Gastrointestinal: Positive for abdominal pain, diarrhea, nausea and vomiting. Negative for blood in stool and constipation.  Genitourinary: Negative.   All other systems reviewed and are negative.   Allergies  Latex  Home Medications   Prior to Admission medications   Medication Sig Start Date End Date Taking? Authorizing Provider  ondansetron (ZOFRAN) 4 MG tablet Take 1 tablet (4 mg total) by mouth every 6 (six) hours. 01/14/17   Janne Napoleon, NP   Meds Ordered and Administered this Visit   Medications  ondansetron (ZOFRAN-ODT) disintegrating tablet 4 mg (not administered)    BP 143/98   Pulse 89   Temp 98.4 F (36.9 C)   Resp 18   LMP 12/27/2015   SpO2 96%  No data found.   Physical Exam  Constitutional: She is oriented to person, place, and time. She appears well-developed and well-nourished. No distress.  Neck: Normal range of motion. Neck supple.  Cardiovascular: Normal rate, regular rhythm, normal heart sounds and intact distal pulses.   Pulmonary/Chest: Effort normal and breath sounds normal. No respiratory distress. She has no wheezes. She has  no rales.  Abdominal: Soft. Bowel sounds are normal. There is no rebound and no guarding.  Mild tenderness in the epigastrium only. Abdomen otherwise soft and nontender.  Musculoskeletal: Normal range of motion. She exhibits no edema.  Neurological: She is alert and oriented to person, place, and time.  Skin: Skin is warm and dry.  Psychiatric: She has a normal mood and affect.  Nursing note and vitals reviewed.   Urgent Care Course   Clinical Course     Procedures (including critical care time)  Labs Review Labs Reviewed - No data to display  Imaging Review No results found.   Visual Acuity  Review  Right Eye Distance:   Left Eye Distance:   Bilateral Distance:    Right Eye Near:   Left Eye Near:    Bilateral Near:         MDM   1. Diarrhea of presumed infectious origin   2. Nausea vomiting and diarrhea   3. Gastroenteritis    Primarily drink clear liquids over the next 24 hours. Recommend drinking at least 2 quarts of Pedialyte over the next 2 days. Slowly advance diet as tolerated. Take the Imodium as directed one now and then one in 4-5 hours is still having 3-4 diarrhea stools and repeat every 4-6 hours if continuing to have 3-4 stools. Do not take sufficient amount to stop the diarrhea.     Janne Napoleon, NP 01/14/17 1353

## 2017-01-14 NOTE — Discharge Instructions (Signed)
Primarily drink clear liquids over the next 24 hours. Recommend drinking at least 2 quarts of Pedialyte over the next 2 days. Slowly advance diet as tolerated. Take the Imodium as directed one now and then one in 4-5 hours is still having 3-4 diarrhea stools and repeat every 4-6 hours if continuing to have 3-4 stools. Do not take sufficient amount to stop the diarrhea.

## 2017-01-21 ENCOUNTER — Ambulatory Visit: Payer: Self-pay | Admitting: Nurse Practitioner

## 2017-02-06 ENCOUNTER — Other Ambulatory Visit (INDEPENDENT_AMBULATORY_CARE_PROVIDER_SITE_OTHER): Payer: BLUE CROSS/BLUE SHIELD

## 2017-02-06 ENCOUNTER — Ambulatory Visit (INDEPENDENT_AMBULATORY_CARE_PROVIDER_SITE_OTHER): Payer: BLUE CROSS/BLUE SHIELD | Admitting: Nurse Practitioner

## 2017-02-06 ENCOUNTER — Encounter: Payer: Self-pay | Admitting: Nurse Practitioner

## 2017-02-06 VITALS — BP 154/96 | HR 98 | Temp 97.9°F | Ht 66.0 in | Wt 171.0 lb

## 2017-02-06 DIAGNOSIS — I10 Essential (primary) hypertension: Secondary | ICD-10-CM | POA: Diagnosis not present

## 2017-02-06 DIAGNOSIS — Z1231 Encounter for screening mammogram for malignant neoplasm of breast: Secondary | ICD-10-CM | POA: Diagnosis not present

## 2017-02-06 DIAGNOSIS — Z7251 High risk heterosexual behavior: Secondary | ICD-10-CM | POA: Diagnosis not present

## 2017-02-06 DIAGNOSIS — Z23 Encounter for immunization: Secondary | ICD-10-CM

## 2017-02-06 DIAGNOSIS — Z803 Family history of malignant neoplasm of breast: Secondary | ICD-10-CM | POA: Insufficient documentation

## 2017-02-06 DIAGNOSIS — B354 Tinea corporis: Secondary | ICD-10-CM

## 2017-02-06 DIAGNOSIS — Z0001 Encounter for general adult medical examination with abnormal findings: Secondary | ICD-10-CM

## 2017-02-06 DIAGNOSIS — Z0000 Encounter for general adult medical examination without abnormal findings: Secondary | ICD-10-CM

## 2017-02-06 DIAGNOSIS — Z1239 Encounter for other screening for malignant neoplasm of breast: Secondary | ICD-10-CM

## 2017-02-06 LAB — CBC WITH DIFFERENTIAL/PLATELET
Basophils Absolute: 0.1 10*3/uL (ref 0.0–0.1)
Basophils Relative: 1.3 % (ref 0.0–3.0)
Eosinophils Absolute: 0.3 10*3/uL (ref 0.0–0.7)
Eosinophils Relative: 5.7 % — ABNORMAL HIGH (ref 0.0–5.0)
HCT: 42.6 % (ref 36.0–46.0)
Hemoglobin: 14.3 g/dL (ref 12.0–15.0)
Lymphocytes Relative: 34.4 % (ref 12.0–46.0)
Lymphs Abs: 2 10*3/uL (ref 0.7–4.0)
MCHC: 33.6 g/dL (ref 30.0–36.0)
MCV: 95 fl (ref 78.0–100.0)
Monocytes Absolute: 0.5 10*3/uL (ref 0.1–1.0)
Monocytes Relative: 9.6 % (ref 3.0–12.0)
Neutro Abs: 2.8 10*3/uL (ref 1.4–7.7)
Neutrophils Relative %: 49 % (ref 43.0–77.0)
Platelets: 214 10*3/uL (ref 150.0–400.0)
RBC: 4.48 Mil/uL (ref 3.87–5.11)
RDW: 13.2 % (ref 11.5–15.5)
WBC: 5.7 10*3/uL (ref 4.0–10.5)

## 2017-02-06 LAB — COMPREHENSIVE METABOLIC PANEL
ALT: 22 U/L (ref 0–35)
AST: 22 U/L (ref 0–37)
Albumin: 4 g/dL (ref 3.5–5.2)
Alkaline Phosphatase: 82 U/L (ref 39–117)
BUN: 8 mg/dL (ref 6–23)
CO2: 26 mEq/L (ref 19–32)
Calcium: 8.9 mg/dL (ref 8.4–10.5)
Chloride: 106 mEq/L (ref 96–112)
Creatinine, Ser: 0.66 mg/dL (ref 0.40–1.20)
GFR: 129.63 mL/min (ref >60.00)
Glucose, Bld: 91 mg/dL (ref 70–99)
Potassium: 3.8 mEq/L (ref 3.5–5.1)
Sodium: 135 mEq/L (ref 135–145)
Total Bilirubin: 0.3 mg/dL (ref 0.2–1.2)
Total Protein: 7.3 g/dL (ref 6.0–8.3)

## 2017-02-06 LAB — LIPID PANEL
Cholesterol: 202 mg/dL — ABNORMAL HIGH (ref 0–200)
HDL: 117.6 mg/dL (ref >39.00)
LDL Cholesterol: 68 mg/dL (ref 0–99)
NonHDL: 84.73
Total CHOL/HDL Ratio: 2
Triglycerides: 84 mg/dL (ref 0.0–149.0)
VLDL: 16.8 mg/dL (ref 0.0–40.0)

## 2017-02-06 LAB — TSH: TSH: 0.73 u[IU]/mL (ref 0.35–4.50)

## 2017-02-06 MED ORDER — HYDROCHLOROTHIAZIDE 12.5 MG PO CAPS: 12.5000 mg | ORAL_CAPSULE | Freq: Every day | ORAL | 3 refills | Status: AC

## 2017-02-06 MED ORDER — CLONIDINE HCL 0.1 MG PO TABS: 0.2000 mg | ORAL_TABLET | Freq: Once | ORAL | Status: AC

## 2017-02-06 MED ORDER — FLUCONAZOLE 150 MG PO TABS: 150.0000 mg | ORAL_TABLET | Freq: Every day | ORAL | 0 refills | Status: DC

## 2017-02-06 MED ORDER — CLOTRIMAZOLE-BETAMETHASONE 1-0.05 % EX CREA: 1.0000 "application " | TOPICAL_CREAM | Freq: Two times a day (BID) | CUTANEOUS | 1 refills | Status: DC

## 2017-02-06 MED ORDER — AMLODIPINE BESYLATE 5 MG PO TABS: 5.0000 mg | ORAL_TABLET | Freq: Every day | ORAL | 3 refills | Status: AC

## 2017-02-06 NOTE — Progress Notes (Signed)
Pre visit review using our clinic review tool, if applicable. No additional management support is needed unless otherwise documented below in the visit note. 

## 2017-02-06 NOTE — Progress Notes (Signed)
Normal results, see office note

## 2017-02-06 NOTE — Patient Instructions (Addendum)
Hypertension Hypertension, commonly called high blood pressure, is when the force of blood pumping through your arteries is too strong. Your arteries are the blood vessels that carry blood from your heart throughout your body. A blood pressure reading consists of a higher number over a lower number, such as 110/72. The higher number (systolic) is the pressure inside your arteries when your heart pumps. The lower number (diastolic) is the pressure inside your arteries when your heart relaxes. Ideally you want your blood pressure below 120/80. Hypertension forces your heart to work harder to pump blood. Your arteries may become narrow or stiff. Having untreated or uncontrolled hypertension can cause heart attack, stroke, kidney disease, and other problems. What increases the risk? Some risk factors for high blood pressure are controllable. Others are not. Risk factors you cannot control include:  Race. You may be at higher risk if you are African American.  Age. Risk increases with age.  Gender. Men are at higher risk than women before age 45 years. After age 65, women are at higher risk than men. Risk factors you can control include:  Not getting enough exercise or physical activity.  Being overweight.  Getting too much fat, sugar, calories, or salt in your diet.  Drinking too much alcohol. What are the signs or symptoms? Hypertension does not usually cause signs or symptoms. Extremely high blood pressure (hypertensive crisis) may cause headache, anxiety, shortness of breath, and nosebleed. How is this diagnosed? To check if you have hypertension, your health care provider will measure your blood pressure while you are seated, with your arm held at the level of your heart. It should be measured at least twice using the same arm. Certain conditions can cause a difference in blood pressure between your right and left arms. A blood pressure reading that is higher than normal on one occasion does  not mean that you need treatment. If it is not clear whether you have high blood pressure, you may be asked to return on a different day to have your blood pressure checked again. Or, you may be asked to monitor your blood pressure at home for 1 or more weeks. How is this treated? Treating high blood pressure includes making lifestyle changes and possibly taking medicine. Living a healthy lifestyle can help lower high blood pressure. You may need to change some of your habits. Lifestyle changes may include:  Following the DASH diet. This diet is high in fruits, vegetables, and whole grains. It is low in salt, red meat, and added sugars.  Keep your sodium intake below 2,300 mg per day.  Getting at least 30-45 minutes of aerobic exercise at least 4 times per week.  Losing weight if necessary.  Not smoking.  Limiting alcoholic beverages.  Learning ways to reduce stress. Your health care provider may prescribe medicine if lifestyle changes are not enough to get your blood pressure under control, and if one of the following is true:  You are 18-59 years of age and your systolic blood pressure is above 140.  You are 60 years of age or older, and your systolic blood pressure is above 150.  Your diastolic blood pressure is above 90.  You have diabetes, and your systolic blood pressure is over 140 or your diastolic blood pressure is over 90.  You have kidney disease and your blood pressure is above 140/90.  You have heart disease and your blood pressure is above 140/90. Your personal target blood pressure may vary depending on your medical   conditions, your age, and other factors. Follow these instructions at home:  Have your blood pressure rechecked as directed by your health care provider.  Take medicines only as directed by your health care provider. Follow the directions carefully. Blood pressure medicines must be taken as prescribed. The medicine does not work as well when you skip  doses. Skipping doses also puts you at risk for problems.  Do not smoke.  Monitor your blood pressure at home as directed by your health care provider. Contact a health care provider if:  You think you are having a reaction to medicines taken.  You have recurrent headaches or feel dizzy.  You have swelling in your ankles.  You have trouble with your vision. Get help right away if:  You develop a severe headache or confusion.  You have unusual weakness, numbness, or feel faint.  You have severe chest or abdominal pain.  You vomit repeatedly.  You have trouble breathing. This information is not intended to replace advice given to you by your health care provider. Make sure you discuss any questions you have with your health care provider. Document Released: 12/16/2005 Document Revised: 05/23/2016 Document Reviewed: 10/08/2013 Elsevier Interactive Patient Education  2017 Elsevier Inc.   Body Ringworm Introduction Body ringworm is an infection of the skin that often causes a ring-shaped rash. Body ringworm can affect any part of your skin. It can spread easily to others. Body ringworm is also called tinea corporis. What are the causes? This condition is caused by funguses called dermatophytes. The condition develops when these funguses grow out of control on the skin. You can get this condition if you touch a person or animal that has it. You can also get it if you share clothing, bedding, towels, or any other object with an infected person or pet. What increases the risk? This condition is more likely to develop in:  Athletes who often make skin-to-skin contact with other athletes, such as wrestlers.  People who share equipment and mats.  People with a weakened immune system. What are the signs or symptoms? Symptoms of this condition include:  Itchy, raised red spots and bumps.  Red scaly patches.  A ring-shaped rash. The rash may have:  A clear center.  Scales or  red bumps at its center.  Redness near its borders.  Dry and scaly skin on or around it. How is this diagnosed? This condition can usually be diagnosed with a skin exam. A skin scraping may be taken from the affected area and examined under a microscope to see if the fungus is present. How is this treated? This condition may be treated with:  An antifungal cream or ointment.  An antifungal shampoo.  Antifungal medicines. These may be prescribed if your ringworm is severe, keeps coming back, or lasts a long time. Follow these instructions at home:  Take over-the-counter and prescription medicines only as told by your health care provider.  If you were given an antifungal cream or ointment:  Use it as told by your health care provider.  Wash the infected area and dry it completely before applying the cream or ointment.  If you were given an antifungal shampoo:  Use it as told by your health care provider.  Leave the shampoo on your body for 3-5 minutes before rinsing.  While you have a rash:  Wear loose clothing to stop clothes from rubbing and irritating it.  Wash or change your bed sheets every night.  If your pet has  the same infection, take your pet to see a Animal nutritionist. How is this prevented?  Practice good hygiene.  Wear sandals or shoes in public places and showers.  Do not share personal items with others.  Avoid touching red patches of skin on other people.  Avoid touching pets that have bald spots.  If you touch an animal that has a bald spot, wash your hands. Contact a health care provider if:  Your rash continues to spread after 7 days of treatment.  Your rash is not gone in 4 weeks.  The area around your rash gets red, warm, tender, and swollen. This information is not intended to replace advice given to you by your health care provider. Make sure you discuss any questions you have with your health care provider. Document Released: 12/13/2000  Document Revised: 05/23/2016 Document Reviewed: 10/12/2015  2017 Elsevier   Breast Self-Awareness Introduction Breast self-awareness means:  Knowing how your breasts look.  Knowing how your breasts feel.  Checking your breasts every month for changes.  Telling your doctor if you notice a change in your breasts. Breast self-awareness allows you to notice a breast problem early while it is still small. How to do a breast self-exam One way to learn what is normal for your breasts and to check for changes is to do a breast self-exam. To do a breast self-exam: Look for Changes  1. Take off all the clothes above your waist. 2. Stand in front of a mirror in a room with good lighting. 3. Put your hands on your hips. 4. Push your hands down. 5. Look at your breasts and nipples in the mirror to see if one breast or nipple looks different than the other. Check to see if:  The shape of one breast is different.  The size of one breast is different.  There are wrinkles, dips, and bumps in one breast and not the other. 6. Look at each breast for changes in your skin, such as:  Redness.  Scaly areas. 7. Look for changes in your nipples, such as:  Liquid around the nipples.  Bleeding.  Dimpling.  Redness.  A change in where the nipples are. Feel for Changes 1. Lie on your back on the floor. 2. Feel each breast. To do this, follow these steps:  Pick a breast to feel.  Put the arm closest to that breast above your head.  Use your other arm to feel the nipple area of your breast. Feel the area with the pads of your three middle fingers by making small circles with your fingers. For the first circle, press lightly. For the second circle, press harder. For the third circle, press even harder.  Keep making circles with your fingers at the light, harder, and even harder pressures as you move down your breast. Stop when you feel your ribs.  Move your fingers a little toward the  center of your body.  Start making circles with your fingers again, this time going up until you reach your collarbone.  Keep making up and down circles until you reach your armpit. Remember to keep using the three pressures.  Feel the other breast in the same way. 3. Sit or stand in the shower or tub. 4. With soapy water on your skin, feel each breast the same way you did in step 2, when you were lying on the floor. Write Down What You Find  After doing the self-exam, write down:  What is normal for each breast.  Any changes you find in each breast.  When you last had your period. How often should I check my breasts? Check your breasts every month. If you are breastfeeding, the best time to check them is after you feed your baby or after you use a breast pump. If you get periods, the best time to check your breasts is 5-7 days after your period is over. When should I see my doctor? See your doctor if you notice:  A change in shape or size of your breasts or nipples.  A change in the skin of your breast or nipples, such as red or scaly skin.  Unusual fluid coming from your nipples.  A lump or thick area that was not there before.  Pain in your breasts.  Anything that concerns you. This information is not intended to replace advice given to you by your health care provider. Make sure you discuss any questions you have with your health care provider. Document Released: 06/03/2008 Document Revised: 05/23/2016 Document Reviewed: 11/05/2015  2017 Elsevier

## 2017-02-06 NOTE — Progress Notes (Signed)
Subjective:    Patient ID: Samantha Howard, female    DOB: [birth year], 36 y.o.   MRN: [id number]  Patient presents today for complete physical or establish care (new patient) and HTN evaluation  Hypertension  This is a new problem. The current episode started more than 1 month ago. The problem is unchanged. The problem is uncontrolled. Pertinent negatives include no anxiety, blurred vision, chest pain, headaches, malaise/fatigue, neck pain, orthopnea, palpitations, peripheral edema, PND or shortness of breath. Risk factors for coronary artery disease include family history and smoking/tobacco exposure. Past treatments include nothing.    Immunizations: (TDAP, Hep C screen, Pneumovax, Influenza, zoster)  Health Maintenance  Topic Date Due  . HIV Screening  02/24/1995  . Tetanus Vaccine  02/24/1999  . Flu Shot  Addressed  . Pap Smear  Excluded   Diet:regular Weight:  Wt Readings from Last 3 Encounters:  02/06/17 171 lb (77.6 kg)  01/24/16 172 lb (78 kg)  12/27/15 170 lb (77.1 kg)   Exercise:none Fall Risk: Fall Risk  02/06/2017  Falls in the past year? No   Home Safety:home with domestic partner Depression/Suicide: Depression screen [facility] 2/9 02/06/2017  Decreased Interest 0  Down, Depressed, Hopeless 0  PHQ - 2 Score 0   No flowsheet data found. Pap Smear (every 85yrs for >21-29 without HPV, every 26yrs for >30-57yrs with HPV):s/p hysterectomy 2016 due menorrhagia, no cervix, no hx of abnormal PAP smear. Vision:needed Dental:needed Advanced Directive: Advanced Directives 12/25/2016  Does Patient Have a Medical Advance Directive? No  Would patient like information on creating a medical advance directive? No - Patient declined  Pre-existing out of facility DNR order (yellow form or pink MOST form) -  Some encounter information is confidential and restricted. Go to Review Flowsheets activity to see all data.   Sexual History (birth control, marital status, STD):same sex  partner, single,   Medications and allergies reviewed with patient and updated if appropriate.  Patient Active Problem List   Diagnosis Date Noted  . HTN (hypertension), benign 02/06/2017  . Family history of breast cancer in first degree relative 02/06/2017  . Bipolar 2 disorder, major depressive episode ([place]) 01/28/2016  . Alcohol dependence with alcohol-induced mood disorder ([place])   . Major depressive disorder, recurrent, severe without psychotic features ([place]) 01/24/2016  . Alcohol abuse 01/19/2016  . MDD (major depressive disorder), recurrent severe, without psychosis ([place]) 01/19/2016  . Bipolar 1 disorder, mixed, severe ([place]) 01/19/2016  . Postoperative state 12/27/2015    Current Outpatient Prescriptions on File Prior to Visit  Medication Sig Dispense Refill  . ondansetron (ZOFRAN) 4 MG tablet Take 1 tablet (4 mg total) by mouth every 6 (six) hours. (Patient not taking: Reported on 02/06/2017) 12 tablet 0   No current facility-administered medications on file prior to visit.     Past Medical History:  Diagnosis Date  . Alcohol use   . Anemia   . Bipolar 1 disorder ([place])   . Blood in stool 2017  . Depression   . Hypertension   . Seizures ([place])    Pt reports none in the last 10 years    Past Surgical History:  Procedure Laterality Date  . ABDOMINAL HYSTERECTOMY  2016  . BILATERAL SALPINGECTOMY Bilateral 12/27/2015   Procedure: BILATERAL SALPINGECTOMY;  Surgeon: Bobbye Charleston, MD;  Location: [facility];  Service: Gynecology;  Laterality: Bilateral;  . CYSTOSCOPY N/A 12/27/2015   Procedure: CYSTOSCOPY;  Surgeon: Bobbye Charleston, MD;  Location: [facility];  Service: Gynecology;  Laterality: N/A;  .  NO PAST SURGERIES    . ROBOTIC ASSISTED TOTAL HYSTERECTOMY N/A 12/27/2015   Procedure: ROBOTIC ASSISTED TOTAL HYSTERECTOMY;  Surgeon: Bobbye Charleston, MD;  Location: [facility];  Service: Gynecology;  Laterality: N/A;    Social History   Social History  . Marital status: Married     Spouse name: N/A  . Number of children: N/A  . Years of education: N/A   Social History Main Topics  . Smoking status: Current Every Day Smoker    Packs/day: 1.00    Types: Cigars  . Smokeless tobacco: Never Used  . Alcohol use 3.6 oz/week    6 Cans of beer per week     Comment: socially  . Drug use: No  . Sexual activity: Not Asked   Other Topics Concern  . None   Social History Narrative  . None    Family History  Problem Relation Age of Onset  . Hypertension Mother   . Cancer Mother 48    ovarian  . Hypertension Maternal Grandmother   . Cancer Maternal Grandmother 50    breast cancer        Review of Systems  Constitutional: Negative for fever, malaise/fatigue and weight loss.  HENT: Negative for congestion and sore throat.   Eyes: Negative for blurred vision.       Negative for visual changes  Respiratory: Negative for cough and shortness of breath.   Cardiovascular: Negative for chest pain, palpitations, orthopnea, leg swelling and PND.  Gastrointestinal: Negative for blood in stool, constipation, diarrhea and heartburn.  Genitourinary: Negative for dysuria, frequency and urgency.  Musculoskeletal: Negative for falls, joint pain, myalgias and neck pain.  Skin: Positive for itching and rash.  Neurological: Negative for dizziness, sensory change and headaches.  Endo/Heme/Allergies: Does not bruise/bleed easily.  Psychiatric/Behavioral: Negative for depression, substance abuse and suicidal ideas. The patient is not nervous/anxious.     Objective:   Vitals:   02/06/17 1512  BP: (!) 164/124  Pulse: 98  Temp: 97.9 F (36.6 C)    Body mass index is 27.6 kg/m.   Physical Examination:  Physical Exam  Constitutional: She is oriented to person, place, and time and well-developed, well-nourished, and in no distress. No distress.  HENT:  Right Ear: External ear normal.  Left Ear: External ear normal.  Nose: Nose normal.  Mouth/Throat: No  oropharyngeal exudate.  Eyes: Conjunctivae and EOM are normal. Pupils are equal, round, and reactive to light. No scleral icterus.  Neck: Normal range of motion. Neck supple. No thyromegaly present.  Cardiovascular: Normal rate, regular rhythm, normal heart sounds and intact distal pulses.   Pulmonary/Chest: Effort normal and breath sounds normal. She exhibits no tenderness and no bony tenderness. Right breast exhibits no inverted nipple, no mass, no nipple discharge, no skin change and no tenderness. Left breast exhibits no inverted nipple, no mass, no nipple discharge, no skin change and no tenderness. Breasts are symmetrical.  Abdominal: Soft. Bowel sounds are normal. She exhibits no distension. There is no tenderness.  Genitourinary: Rectum normal and vulva normal. Cervix exhibits no motion tenderness.  Genitourinary Comments: Deferred by patient  Musculoskeletal: Normal range of motion. She exhibits no edema or tenderness.  Lymphadenopathy:    She has no cervical adenopathy.  Neurological: She is alert and oriented to person, place, and time. Gait normal.  Skin: Skin is warm and dry.  Psychiatric: Affect and judgment normal.  Vitals reviewed.   ASSESSMENT and PLAN:  Samantha Howard was seen today for establish care.  Diagnoses and all orders for this visit:  Encounter for preventative adult health care exam with abnormal findings -     CBC w/Diff; Future -     Comprehensive metabolic panel; Future -     TSH; Future -     Lipid panel; Future  HTN (hypertension), benign -     Comprehensive metabolic panel; Future -     amLODipine (NORVASC) 5 MG tablet; Take 1 tablet (5 mg total) by mouth daily. -     hydrochlorothiazide (MICROZIDE) 12.5 MG capsule; Take 1 capsule (12.5 mg total) by mouth daily. -     cloNIDine (CATAPRES) tablet 0.2 mg; Take 2 tablets (0.2 mg total) by mouth once.  Tinea corporis -     fluconazole (DIFLUCAN) 150 MG tablet; Take 1 tablet (150 mg total) by mouth daily.  Take 1tab every 3days till complete -     clotrimazole-betamethasone (LOTRISONE) cream; Apply 1 application topically 2 (two) times daily.  Unprotected sexual intercourse -     HIV antibody; Future  Breast cancer screening, high risk patient -     MM Digital Screening; Future  Family history of breast cancer in first degree relative -     MM Digital Screening; Future   No problem-specific Assessment & Plan notes found for this encounter.     Follow up: Return in about 1 week (around 02/13/2017) for HTN.  Wilfred Lacy, NP

## 2017-02-07 LAB — HIV ANTIBODY (ROUTINE TESTING W REFLEX): HIV 1&2 Ab, 4th Generation: NONREACTIVE

## 2017-02-14 ENCOUNTER — Ambulatory Visit: Payer: Self-pay | Admitting: Nurse Practitioner

## 2017-02-27 ENCOUNTER — Ambulatory Visit (INDEPENDENT_AMBULATORY_CARE_PROVIDER_SITE_OTHER): Payer: BLUE CROSS/BLUE SHIELD | Admitting: Nurse Practitioner

## 2017-02-27 ENCOUNTER — Encounter: Payer: Self-pay | Admitting: Nurse Practitioner

## 2017-02-27 VITALS — BP 150/108 | HR 100 | Temp 98.2°F | Ht 66.0 in | Wt 163.2 lb

## 2017-02-27 DIAGNOSIS — I10 Essential (primary) hypertension: Secondary | ICD-10-CM

## 2017-02-27 DIAGNOSIS — R634 Abnormal weight loss: Secondary | ICD-10-CM | POA: Diagnosis not present

## 2017-02-27 NOTE — Progress Notes (Signed)
Pre visit review using our clinic review tool, if applicable. No additional management support is needed unless otherwise documented below in the visit note. 

## 2017-02-27 NOTE — Progress Notes (Signed)
Subjective:  Patient ID: Samantha Howard, female    DOB: December 11, 1981  Age: 36 y.o. MRN: [id number]  CC: Hypertension (follow. Not taking the amlodipine)   Hypertension  This is a chronic problem. The current episode started more than 1 year ago. The problem is unchanged. The problem is uncontrolled. Pertinent negatives include no anxiety, blurred vision, chest pain, headaches, malaise/fatigue, neck pain, orthopnea, palpitations, peripheral edema, PND, shortness of breath or sweats. There are no associated agents to hypertension. Risk factors for coronary artery disease include family history, smoking/tobacco exposure and sedentary lifestyle. Past treatments include diuretics. The current treatment provides no improvement. Compliance problems include exercise, diet and psychosocial issues (did not start amlodipine as prescribed. only taking HCTZ at this time).    Weight loss: Unintentional, denies any GI or GU symptoms or temperature intolerance or hair loss or change in appetite. Continuous tobacco and ETOH use. Drink 3-25oz beers per day.  Outpatient Medications Prior to Visit  Medication Sig Dispense Refill  . hydrochlorothiazide (MICROZIDE) 12.5 MG capsule Take 1 capsule (12.5 mg total) by mouth daily. 30 capsule 3  . amLODipine (NORVASC) 5 MG tablet Take 1 tablet (5 mg total) by mouth daily. (Patient not taking: Reported on 02/27/2017) 30 tablet 3  . naproxen (NAPROSYN) 250 MG tablet Take by mouth 2 (two) times daily with a meal.    . clotrimazole-betamethasone (LOTRISONE) cream Apply 1 application topically 2 (two) times daily. 100 g 1  . fluconazole (DIFLUCAN) 150 MG tablet Take 1 tablet (150 mg total) by mouth daily. Take 1tab every 3days till complete 3 tablet 0  . ondansetron (ZOFRAN) 4 MG tablet Take 1 tablet (4 mg total) by mouth every 6 (six) hours. (Patient not taking: Reported on 02/06/2017) 12 tablet 0   Facility-Administered Medications Prior to Visit  Medication Dose Route  Frequency Provider Last Rate Last Dose  . cloNIDine (CATAPRES) tablet 0.2 mg  0.2 mg Oral Once Flossie Buffy, NP        ROS See HPI  Objective:  BP (!) 150/108 (BP Location: Left Arm, Patient Position: Sitting, Cuff Size: Normal)   Pulse 100   Temp 98.2 F (36.8 C) (Oral)   Ht 5\' 6"  (1.676 m)   Wt 163 lb 4 oz (74 kg)   LMP 12/27/2015   SpO2 97%   BMI 26.35 kg/m   BP Readings from Last 3 Encounters:  02/27/17 (!) 150/108  02/06/17 (!) 154/96  01/14/17 143/98    Wt Readings from Last 3 Encounters:  02/27/17 163 lb 4 oz (74 kg)  02/06/17 171 lb (77.6 kg)  01/24/16 172 lb (78 kg)    Physical Exam  Constitutional: She is oriented to person, place, and time. No distress.  HENT:  Right Ear: External ear normal.  Left Ear: External ear normal.  Nose: Nose normal.  Mouth/Throat: No oropharyngeal exudate.  Eyes: No scleral icterus.  Neck: Normal range of motion. Neck supple. No thyromegaly present.  Cardiovascular: Normal rate, regular rhythm and normal heart sounds.   Pulmonary/Chest: Effort normal and breath sounds normal. No respiratory distress.  Abdominal: Soft. She exhibits no distension.  Musculoskeletal: Normal range of motion. She exhibits no edema.  Lymphadenopathy:    She has no cervical adenopathy.  Neurological: She is alert and oriented to person, place, and time.  Skin: Skin is warm and dry.  Psychiatric: She has a normal mood and affect. Her behavior is normal.    Lab Results  Component Value Date   WBC  5.7 02/06/2017   HGB 14.3 02/06/2017   HCT 42.6 02/06/2017   PLT 214.0 02/06/2017   GLUCOSE 91 02/06/2017   CHOL 202 (H) 02/06/2017   TRIG 84.0 02/06/2017   HDL 117.60 02/06/2017   LDLCALC 68 02/06/2017   ALT 22 02/06/2017   AST 22 02/06/2017   NA 135 02/06/2017   K 3.8 02/06/2017   CL 106 02/06/2017   CREATININE 0.66 02/06/2017   BUN 8 02/06/2017   CO2 26 02/06/2017   TSH 0.73 02/06/2017   HGBA1C 5.6 01/26/2016    No results  found.  Assessment & Plan:   Samantha Howard was seen today for hypertension.  Diagnoses and all orders for this visit:  HTN (hypertension), benign  Weight loss, abnormal   I have discontinued Samantha Howard's ondansetron, fluconazole, and clotrimazole-betamethasone. I am also having her maintain her naproxen, amLODipine, and hydrochlorothiazide. We will continue to administer cloNIDine.  No orders of the defined types were placed in this encounter.   Follow-up: Return in about 1 year (around 02/27/2018) for HTN and weight loss.Wilfred Lacy, NP

## 2017-02-27 NOTE — Patient Instructions (Addendum)
Take amlodipine and HCTZ as prescribed.  Decrease ETOH consumption.  Stop tobacco use.   call and schedule mammogram.  If continues to loss weight, consider the following: CXR, CT ABD/pelvis, BMP, and complete thyroid function panel.  DASH Eating Plan DASH stands for "Dietary Approaches to Stop Hypertension." The DASH eating plan is a healthy eating plan that has been shown to reduce high blood pressure (hypertension). It may also reduce your risk for type 2 diabetes, heart disease, and stroke. The DASH eating plan may also help with weight loss. What are tips for following this plan? General guidelines   Avoid eating more than 2,300 mg (milligrams) of salt (sodium) a day. If you have hypertension, you may need to reduce your sodium intake to 1,500 mg a day.  Limit alcohol intake to no more than 1 drink a day for nonpregnant women and 2 drinks a day for men. One drink equals 12 oz of beer, 5 oz of wine, or 1 oz of hard liquor.  Work with your health care provider to maintain a healthy body weight or to lose weight. Ask what an ideal weight is for you.  Get at least 30 minutes of exercise that causes your heart to beat faster (aerobic exercise) most days of the week. Activities may include walking, swimming, or biking.  Work with your health care provider or diet and nutrition specialist (dietitian) to adjust your eating plan to your individual calorie needs. Reading food labels   Check food labels for the amount of sodium per serving. Choose foods with less than 5 percent of the Daily Value of sodium. Generally, foods with less than 300 mg of sodium per serving fit into this eating plan.  To find whole grains, look for the word "whole" as the first word in the ingredient list. Shopping   Buy products labeled as "low-sodium" or "no salt added."  Buy fresh foods. Avoid canned foods and premade or frozen meals. Cooking   Avoid adding salt when cooking. Use salt-free seasonings or  herbs instead of table salt or sea salt. Check with your health care provider or pharmacist before using salt substitutes.  Do not fry foods. Cook foods using healthy methods such as baking, boiling, grilling, and broiling instead.  Cook with heart-healthy oils, such as olive, canola, soybean, or sunflower oil. Meal planning    Eat a balanced diet that includes:  5 or more servings of fruits and vegetables each day. At each meal, try to fill half of your plate with fruits and vegetables.  Up to 6-8 servings of whole grains each day.  Less than 6 oz of lean meat, poultry, or fish each day. A 3-oz serving of meat is about the same size as a deck of cards. One egg equals 1 oz.  2 servings of low-fat dairy each day.  A serving of nuts, seeds, or beans 5 times each week.  Heart-healthy fats. Healthy fats called Omega-3 fatty acids are found in foods such as flaxseeds and coldwater fish, like sardines, salmon, and mackerel.  Limit how much you eat of the following:  Canned or prepackaged foods.  Food that is high in trans fat, such as fried foods.  Food that is high in saturated fat, such as fatty meat.  Sweets, desserts, sugary drinks, and other foods with added sugar.  Full-fat dairy products.  Do not salt foods before eating.  Try to eat at least 2 vegetarian meals each week.  Eat more home-cooked food and less  restaurant, buffet, and fast food.  When eating at a restaurant, ask that your food be prepared with less salt or no salt, if possible. What foods are recommended? The items listed may not be a complete list. Talk with your dietitian about what dietary choices are best for you. Grains  Whole-grain or whole-wheat bread. Whole-grain or whole-wheat pasta. Brown rice. [place]. Bulgur. Whole-grain and low-sodium cereals. Pita bread. Low-fat, low-sodium crackers. Whole-wheat flour tortillas. Vegetables  Fresh or frozen vegetables (raw, steamed, roasted, or  grilled). Low-sodium or reduced-sodium tomato and vegetable juice. Low-sodium or reduced-sodium tomato sauce and tomato paste. Low-sodium or reduced-sodium canned vegetables. Fruits  All fresh, dried, or frozen fruit. Canned fruit in natural juice (without added sugar). Meat and other protein foods  Skinless chicken or [place]. Ground chicken or [place]. Pork with fat trimmed off. Fish and seafood. Egg whites. Dried beans, peas, or lentils. Unsalted nuts, nut butters, and seeds. Unsalted canned beans. Lean cuts of beef with fat trimmed off. Low-sodium, lean deli meat. Dairy  Low-fat (1%) or fat-free (skim) milk. Fat-free, low-fat, or reduced-fat cheeses. Nonfat, low-sodium ricotta or cottage cheese. Low-fat or nonfat yogurt. Low-fat, low-sodium cheese. Fats and oils  Soft margarine without trans fats. Vegetable oil. Low-fat, reduced-fat, or light mayonnaise and salad dressings (reduced-sodium). Canola, safflower, olive, soybean, and sunflower oils. Avocado. Seasoning and other foods  Herbs. Spices. Seasoning mixes without salt. Unsalted popcorn and pretzels. Fat-free sweets. What foods are not recommended? The items listed may not be a complete list. Talk with your dietitian about what dietary choices are best for you. Grains  Baked goods made with fat, such as croissants, muffins, or some breads. Dry pasta or rice meal packs. Vegetables  Creamed or fried vegetables. Vegetables in a cheese sauce. Regular canned vegetables (not low-sodium or reduced-sodium). Regular canned tomato sauce and paste (not low-sodium or reduced-sodium). Regular tomato and vegetable juice (not low-sodium or reduced-sodium). Angie Fava. Olives. Fruits  Canned fruit in a light or heavy syrup. Fried fruit. Fruit in cream or butter sauce. Meat and other protein foods  Fatty cuts of meat. Ribs. Fried meat. Berniece Salines. Sausage. Bologna and other processed lunch meats. Salami. Fatback. Hotdogs. Bratwurst. Salted nuts and seeds. Canned  beans with added salt. Canned or smoked fish. Whole eggs or egg yolks. Chicken or [place] with skin. Dairy  Whole or 2% milk, cream, and half-and-half. Whole or full-fat cream cheese. Whole-fat or sweetened yogurt. Full-fat cheese. Nondairy creamers. Whipped toppings. Processed cheese and cheese spreads. Fats and oils  Butter. Stick margarine. Lard. Shortening. Ghee. Bacon fat. Tropical oils, such as coconut, palm kernel, or palm oil. Seasoning and other foods  Salted popcorn and pretzels. Onion salt, garlic salt, seasoned salt, table salt, and sea salt. Worcestershire sauce. Tartar sauce. Barbecue sauce. Teriyaki sauce. Soy sauce, including reduced-sodium. Steak sauce. Canned and packaged gravies. Fish sauce. Oyster sauce. Cocktail sauce. Horseradish that you find on the shelf. Ketchup. Mustard. Meat flavorings and tenderizers. Bouillon cubes. Hot sauce and Tabasco sauce. Premade or packaged marinades. Premade or packaged taco seasonings. Relishes. Regular salad dressings. Where to find more information:  National Heart, Lung, and [place]: https://wilson-eaton.com/  American Heart Association: www.heart.org Summary  The DASH eating plan is a healthy eating plan that has been shown to reduce high blood pressure (hypertension). It may also reduce your risk for type 2 diabetes, heart disease, and stroke.  With the DASH eating plan, you should limit salt (sodium) intake to 2,300 mg a day. If you have hypertension,  you may need to reduce your sodium intake to 1,500 mg a day.  When on the DASH eating plan, aim to eat more fresh fruits and vegetables, whole grains, lean proteins, low-fat dairy, and heart-healthy fats.  Work with your health care provider or diet and nutrition specialist (dietitian) to adjust your eating plan to your individual calorie needs. This information is not intended to replace advice given to you by your health care provider. Make sure you discuss any questions you have with  your health care provider. Document Released: 12/05/2011 Document Revised: 12/09/2016 Document Reviewed: 12/09/2016 Elsevier Interactive Patient Education  2017 [place]

## 2017-03-06 ENCOUNTER — Ambulatory Visit: Payer: Self-pay | Admitting: Nurse Practitioner

## 2017-03-06 DIAGNOSIS — Z0289 Encounter for other administrative examinations: Secondary | ICD-10-CM

## 2017-03-12 ENCOUNTER — Ambulatory Visit
Admission: RE | Admit: 2017-03-12 | Discharge: 2017-03-12 | Disposition: A | Discharge status: Home / Self Care | Payer: BLUE CROSS/BLUE SHIELD | Source: Ambulatory Visit | Attending: Nurse Practitioner | Admitting: Nurse Practitioner

## 2017-03-12 DIAGNOSIS — Z803 Family history of malignant neoplasm of breast: Secondary | ICD-10-CM

## 2017-03-12 DIAGNOSIS — Z1239 Encounter for other screening for malignant neoplasm of breast: Secondary | ICD-10-CM

## 2018-06-14 ENCOUNTER — Other Ambulatory Visit: Payer: Self-pay

## 2018-06-14 ENCOUNTER — Emergency Department (HOSPITAL_COMMUNITY)
Admission: EM | Admit: 2018-06-14 | Discharge: 2018-06-14 | Disposition: A | Discharge status: Home / Self Care | Payer: BLUE CROSS/BLUE SHIELD | Attending: Emergency Medicine | Admitting: Emergency Medicine

## 2018-06-14 ENCOUNTER — Encounter (HOSPITAL_COMMUNITY): Payer: Self-pay | Admitting: *Deleted

## 2018-06-14 DIAGNOSIS — Z79899 Other long term (current) drug therapy: Secondary | ICD-10-CM | POA: Insufficient documentation

## 2018-06-14 DIAGNOSIS — F17210 Nicotine dependence, cigarettes, uncomplicated: Secondary | ICD-10-CM | POA: Insufficient documentation

## 2018-06-14 DIAGNOSIS — F10920 Alcohol use, unspecified with intoxication, uncomplicated: Secondary | ICD-10-CM

## 2018-06-14 DIAGNOSIS — I10 Essential (primary) hypertension: Secondary | ICD-10-CM | POA: Diagnosis not present

## 2018-06-14 DIAGNOSIS — M25519 Pain in unspecified shoulder: Secondary | ICD-10-CM | POA: Diagnosis present

## 2018-06-14 NOTE — ED Notes (Signed)
Pt ambulatory upon leaving without any disturbance in gait, alert and oriented

## 2018-06-14 NOTE — ED Triage Notes (Addendum)
pts wife called police to bring pt to hospital. Pt states she was playing with children and not sure why she called the police. Pt admits to some ETOH, denies drug use. Pt states she has a Rt dislocated shoulder from a month ago

## 2018-06-14 NOTE — Discharge Instructions (Addendum)
Avoid drinking excessive amounts of alcohol  See your therapist and doctor of choice as needed for problems

## 2018-06-14 NOTE — ED Provider Notes (Signed)
[facility] DEPT Provider Note   CSN: [id number] Arrival date & time: 06/14/18  1546     History   Chief Complaint Chief Complaint  Patient presents with  . Shoulder Pain    HPI Oddie Kuhlmann is a 37 y.o. female.  HPI   This patient was brought in by police after her wife called them, to report that she was suicidal.  Patient states that she and her wife have been arguing yesterday and today she told her wife that she was leaving, and taking the children, which are hers, the patient's.  Patient admits to drinking some alcohol today, more than usual.  States that she is not suicidal, she has no reason to kill herself, and that she loves her children would not want to leave them alone.  She denies use of illegal drugs.  She states that she sees a Social worker at work every week on Wednesday.  She does not take antidepressant or antipsychotic medications.  She has not had any recent illnesses.  There are no other known modifying factors.  Past Medical History:  Diagnosis Date  . Alcohol use   . Anemia   . Bipolar 1 disorder ([place])   . Blood in stool 2017  . Depression   . Hypertension   . Seizures ([place])    Pt reports none in the last 10 years    Patient Active Problem List   Diagnosis Date Noted  . HTN (hypertension), benign 02/06/2017  . Family history of breast cancer in first degree relative 02/06/2017  . Bipolar 2 disorder, major depressive episode ([place]) 01/28/2016  . Alcohol dependence with alcohol-induced mood disorder ([place])   . Major depressive disorder, recurrent, severe without psychotic features ([place]) 01/24/2016  . Alcohol abuse 01/19/2016  . MDD (major depressive disorder), recurrent severe, without psychosis ([place]) 01/19/2016  . Bipolar 1 disorder, mixed, severe ([place]) 01/19/2016  . Postoperative state 12/27/2015    Past Surgical History:  Procedure Laterality Date  . ABDOMINAL HYSTERECTOMY  2016  . BILATERAL SALPINGECTOMY  Bilateral 12/27/2015   Procedure: BILATERAL SALPINGECTOMY;  Surgeon: Bobbye Charleston, MD;  Location: [facility];  Service: Gynecology;  Laterality: Bilateral;  . CYSTOSCOPY N/A 12/27/2015   Procedure: CYSTOSCOPY;  Surgeon: Bobbye Charleston, MD;  Location: [facility];  Service: Gynecology;  Laterality: N/A;  . NO PAST SURGERIES    . ROBOTIC ASSISTED TOTAL HYSTERECTOMY N/A 12/27/2015   Procedure: ROBOTIC ASSISTED TOTAL HYSTERECTOMY;  Surgeon: Bobbye Charleston, MD;  Location: [facility];  Service: Gynecology;  Laterality: N/A;     OB History    Gravida  0   Para      Term      Preterm      AB      Living        SAB      TAB      Ectopic      Multiple      Live Births               Home Medications    Prior to Admission medications   Medication Sig Start Date End Date Taking? Authorizing Provider  ibuprofen (ADVIL,MOTRIN) 200 MG tablet Take 800 mg by mouth every 6 (six) hours as needed for moderate pain.   Yes [provider]  amLODipine (NORVASC) 5 MG tablet Take 1 tablet (5 mg total) by mouth daily. Patient not taking: Reported on 02/27/2017 02/06/17   Nche, Charlene Brooke, NP  hydrochlorothiazide (MICROZIDE) 12.5 MG capsule  Take 1 capsule (12.5 mg total) by mouth daily. Patient not taking: Reported on 06/14/2018 02/06/17   Nche, Charlene Brooke, NP    Family History Family History  Problem Relation Age of Onset  . Hypertension Mother   . Cancer Mother 37       ovarian  . Breast cancer Mother   . Hypertension Maternal Grandmother   . Cancer Maternal Grandmother 93       breast cancer    Social History Social History   Tobacco Use  . Smoking status: Current Every Day Smoker    Packs/day: 1.00    Types: Cigars  . Smokeless tobacco: Never Used  Substance Use Topics  . Alcohol use: Yes    Alcohol/week: 3.6 oz    Types: 6 Cans of beer per week    Comment: socially  . Drug use: No     Allergies   Latex   Review of Systems Review of Systems  All other  systems reviewed and are negative.    Physical Exam Updated Vital Signs BP (!) 149/108 (BP Location: Left Arm)   Pulse (!) 110   Temp 98.1 F (36.7 C) (Oral)   Resp 18   Ht 5\' 6"  (1.676 m)   Wt 67.6 kg (149 lb)   LMP 12/27/2015   SpO2 98%   BMI 24.05 kg/m   Physical Exam  Constitutional: She is oriented to person, place, and time. She appears well-developed and well-nourished. No distress.  HENT:  Head: Normocephalic and atraumatic.  Eyes: Pupils are equal, round, and reactive to light. Conjunctivae and EOM are normal.  Neck: Normal range of motion and phonation normal. Neck supple.  Cardiovascular: Normal rate and regular rhythm.  Pulmonary/Chest: Effort normal and breath sounds normal. She exhibits no tenderness.  Abdominal: Soft. She exhibits no distension. There is no tenderness. There is no guarding.  Musculoskeletal: Normal range of motion.  Neurological: She is alert and oriented to person, place, and time. She exhibits normal muscle tone.  No dysarthria or aphasia.  Skin: Skin is warm and dry.  Psychiatric: She has a normal mood and affect. Her behavior is normal. Judgment and thought content normal.  No internal responsiveness.  She is somewhat defensive, when discussing suicidal ideation possibility.  Nursing note and vitals reviewed.    ED Treatments / Results  Labs (all labs ordered are listed, but only abnormal results are displayed) Labs Reviewed - No data to display  EKG None  Radiology No results found.  Procedures Procedures (including critical care time)  Medications Ordered in ED Medications - No data to display   Initial Impression / Assessment and Plan / ED Course  I have reviewed the triage vital signs and the nursing notes.  Pertinent labs & imaging results that were available during my care of the patient were reviewed by me and considered in my medical decision making (see chart for details).      Patient Vitals for the past 24  hrs:  BP Temp Temp src Pulse Resp SpO2 Height Weight  06/14/18 1603 - - - - - - 5\' 6"  (1.676 m) 67.6 kg (149 lb)  06/14/18 1552 (!) 149/108 98.1 F (36.7 C) Oral (!) 110 18 98 % - -    5:04 PM Reevaluation with update and discussion. After initial assessment and treatment, an updated evaluation reveals patient is sleeping, arouses easily, noted to be somewhat dysarthric at this time.  She continues to deny suicidal ideation or plan.  Findings discussed  and questions answered. Daleen Bo   Medical Decision Making: Alleged suicidal ideation, patient who is not overtly intoxicated, altered or aggressive, denies being suicidal.  Initial blood pressure was elevated.  Patient is cooperative.  I suspect that she is mildly intoxicated, but has capacity for decision-making.  She is somewhat defensive, but not aggressive or appearing psychotic.   CRITICAL CARE-no Performed by: Daleen Bo   Nursing Notes Reviewed/ Care Coordinated Applicable Imaging Reviewed Interpretation of Laboratory Data incorporated into ED treatment  The patient appears reasonably screened and/or stabilized for discharge and I doubt any other medical condition or other [facility] requiring further screening, evaluation, or treatment in the ED at this time prior to discharge.  Plan: Home Medications-take usual medications; Home Treatments-avoid alcohol; return here if the recommended treatment, does not improve the symptoms; Recommended follow up-PCP and therapist, as needed     Final Clinical Impressions(s) / ED Diagnoses   Final diagnoses:  Alcoholic intoxication without complication [facility])    ED Discharge Orders    None       Daleen Bo, MD 06/14/18 1705

## 2018-06-14 NOTE — ED Notes (Signed)
ASSESSMENT: Denies any problems as described by police upon arrival. Pt admits to intoxication.

## 2019-03-27 ENCOUNTER — Other Ambulatory Visit: Payer: Self-pay

## 2019-03-27 ENCOUNTER — Encounter (HOSPITAL_COMMUNITY): Payer: Self-pay | Admitting: Emergency Medicine

## 2019-03-27 ENCOUNTER — Emergency Department (HOSPITAL_COMMUNITY)
Admission: EM | Admit: 2019-03-27 | Discharge: 2019-03-27 | Disposition: A | Discharge status: Home / Self Care | Payer: BLUE CROSS/BLUE SHIELD | Attending: Emergency Medicine | Admitting: Emergency Medicine

## 2019-03-27 DIAGNOSIS — Z91040 Latex allergy status: Secondary | ICD-10-CM | POA: Insufficient documentation

## 2019-03-27 DIAGNOSIS — F17290 Nicotine dependence, other tobacco product, uncomplicated: Secondary | ICD-10-CM | POA: Insufficient documentation

## 2019-03-27 DIAGNOSIS — I10 Essential (primary) hypertension: Secondary | ICD-10-CM | POA: Insufficient documentation

## 2019-03-27 DIAGNOSIS — H00016 Hordeolum externum left eye, unspecified eyelid: Secondary | ICD-10-CM | POA: Insufficient documentation

## 2019-03-27 MED ORDER — DOXYCYCLINE HYCLATE 100 MG PO CAPS: 100.0000 mg | ORAL_CAPSULE | Freq: Two times a day (BID) | ORAL | 0 refills | Status: AC

## 2019-03-27 MED ORDER — FLUORESCEIN SODIUM 1 MG OP STRP
1.0000 | ORAL_STRIP | Freq: Once | OPHTHALMIC | Status: DC
  Filled 2019-03-27: qty 1

## 2019-03-27 MED ORDER — TETRACAINE HCL 0.5 % OP SOLN
2.0000 [drp] | Freq: Once | OPHTHALMIC | Status: DC
  Filled 2019-03-27: qty 4

## 2019-03-27 NOTE — ED Provider Notes (Signed)
[facility] DEPT Provider Note   CSN: [id number] Arrival date & time: 03/27/19  2000    History   Chief Complaint Chief Complaint  Patient presents with  . Eye Problem    HPI Samantha Howard is a 38 y.o. female.     38 year old female presents with left lower lid swelling x3 weeks.  Has noticed some drainage from the area.  Has had blurred vision neck is better when she blinks.  Denies any fever or chills.  Does not wear any corrective lenses.  Symptoms persistent and she has been treating with warm compresses.  Has not been seen by a specialist     Past Medical History:  Diagnosis Date  . Alcohol use   . Anemia   . Bipolar 1 disorder ([place])   . Blood in stool 2017  . Depression   . Hypertension   . Seizures ([place])    Pt reports none in the last 10 years    Patient Active Problem List   Diagnosis Date Noted  . HTN (hypertension), benign 02/06/2017  . Family history of breast cancer in first degree relative 02/06/2017  . Bipolar 2 disorder, major depressive episode ([place]) 01/28/2016  . Alcohol dependence with alcohol-induced mood disorder ([place])   . Major depressive disorder, recurrent, severe without psychotic features ([place]) 01/24/2016  . Alcohol abuse 01/19/2016  . MDD (major depressive disorder), recurrent severe, without psychosis ([place]) 01/19/2016  . Bipolar 1 disorder, mixed, severe ([place]) 01/19/2016  . Postoperative state 12/27/2015    Past Surgical History:  Procedure Laterality Date  . ABDOMINAL HYSTERECTOMY  2016  . BILATERAL SALPINGECTOMY Bilateral 12/27/2015   Procedure: BILATERAL SALPINGECTOMY;  Surgeon: Bobbye Charleston, MD;  Location: [facility];  Service: Gynecology;  Laterality: Bilateral;  . CYSTOSCOPY N/A 12/27/2015   Procedure: CYSTOSCOPY;  Surgeon: Bobbye Charleston, MD;  Location: [facility];  Service: Gynecology;  Laterality: N/A;  . NO PAST SURGERIES    . ROBOTIC ASSISTED TOTAL HYSTERECTOMY N/A 12/27/2015   Procedure: ROBOTIC ASSISTED TOTAL HYSTERECTOMY;  Surgeon: Bobbye Charleston, MD;  Location: [facility];  Service: Gynecology;  Laterality: N/A;     OB History    Gravida  0   Para      Term      Preterm      AB      Living        SAB      TAB      Ectopic      Multiple      Live Births               Home Medications    Prior to Admission medications   Medication Sig Start Date End Date Taking? Authorizing Provider  amLODipine (NORVASC) 5 MG tablet Take 1 tablet (5 mg total) by mouth daily. Patient not taking: Reported on 02/27/2017 02/06/17   Nche, Charlene Brooke, NP  hydrochlorothiazide (MICROZIDE) 12.5 MG capsule Take 1 capsule (12.5 mg total) by mouth daily. Patient not taking: Reported on 06/14/2018 02/06/17   Nche, Charlene Brooke, NP  ibuprofen (ADVIL,MOTRIN) 200 MG tablet Take 800 mg by mouth every 6 (six) hours as needed for moderate pain.    [provider]    Family History Family History  Problem Relation Age of Onset  . Hypertension Mother   . Cancer Mother 4       ovarian  . Breast cancer Mother   . Hypertension Maternal Grandmother   . Cancer Maternal Grandmother 63  breast cancer    Social History Social History   Tobacco Use  . Smoking status: Current Every Day Smoker    Packs/day: 1.00    Types: Cigars  . Smokeless tobacco: Never Used  Substance Use Topics  . Alcohol use: Yes    Alcohol/week: 6.0 standard drinks    Types: 6 Cans of beer per week    Comment: socially  . Drug use: No     Allergies   Latex and Other   Review of Systems Review of Systems  All other systems reviewed and are negative.    Physical Exam Updated Vital Signs BP (!) 159/113 (BP Location: Left Arm)   Pulse (!) 116   Temp 98.4 F (36.9 C) (Oral)   Resp 18   Ht 1.676 m (5\' 6" )   Wt 77.1 kg   LMP 12/27/2015   SpO2 96%   BMI 27.44 kg/m   Physical Exam Vitals signs and nursing note reviewed.  Constitutional:      Appearance: She is  well-developed. She is not toxic-appearing.  HENT:     Head: Normocephalic and atraumatic.  Eyes:     General:        Left eye: Hordeolum present.No foreign body.     Conjunctiva/sclera: Conjunctivae normal.     Left eye: No chemosis.    Pupils: Pupils are equal, round, and reactive to light.  Neck:     Musculoskeletal: Normal range of motion.  Cardiovascular:     Rate and Rhythm: Normal rate.  Pulmonary:     Effort: Pulmonary effort is normal.  Skin:    General: Skin is warm and dry.  Neurological:     Mental Status: She is alert and oriented to person, place, and time.      ED Treatments / Results  Labs (all labs ordered are listed, but only abnormal results are displayed) Labs Reviewed - No data to display  EKG None  Radiology No results found.  Procedures Procedures (including critical care time)  Medications Ordered in ED Medications  tetracaine (PONTOCAINE) 0.5 % ophthalmic solution 2 drop (has no administration in time range)  fluorescein ophthalmic strip 1 strip (has no administration in time range)     Initial Impression / Assessment and Plan / ED Course  I have reviewed the triage vital signs and the nursing notes.  Pertinent labs & imaging results that were available during my care of the patient were reviewed by me and considered in my medical decision making (see chart for details).        Patient instructed to continue using warm soaks.  No evidence of septal or preseptal cellulitis.  Will give referral to ophthalmology on-call and placed on antibiotics  Final Clinical Impressions(s) / ED Diagnoses   Final diagnoses:  None    ED Discharge Orders    None       Lacretia Leigh, MD 03/27/19 2057

## 2019-03-27 NOTE — ED Notes (Signed)
Pt verbalized discharge instructions and follow up care. Eye patch put on pt. Alert and ambulatory

## 2019-03-27 NOTE — Discharge Instructions (Signed)
Follow-up with Dr. Nancy Fetter, [phone] next week.  Call the office to schedule your visit

## 2019-03-27 NOTE — ED Notes (Signed)
All materials at bedside

## 2019-03-27 NOTE — ED Triage Notes (Signed)
Patient is complaining of a bump that has developed on her left lower eyelid. Patient states it started out as pink eye about three weeks ago.

## 2019-03-27 NOTE — ED Notes (Signed)
Pt ambulated to the bathroom without any assistance and steady gait

## 2019-04-07 ENCOUNTER — Telehealth: Payer: Self-pay | Admitting: Nurse Practitioner

## 2019-04-07 NOTE — Telephone Encounter (Signed)
I called and left message on patient voicemail per Charoltte Nche to call office and schedule a follow up appointment for HTN. Patient has not been seen since 02/2017.

## 2021-07-25 ENCOUNTER — Emergency Department (HOSPITAL_COMMUNITY): Payer: Self-pay

## 2021-07-25 ENCOUNTER — Encounter (HOSPITAL_COMMUNITY): Payer: Self-pay | Admitting: *Deleted

## 2021-07-25 ENCOUNTER — Other Ambulatory Visit: Payer: Self-pay

## 2021-07-25 ENCOUNTER — Emergency Department (HOSPITAL_COMMUNITY)
Admission: EM | Admit: 2021-07-25 | Discharge: 2021-07-25 | Disposition: A | Discharge status: Home / Self Care | Payer: Self-pay | Attending: Emergency Medicine | Admitting: Emergency Medicine

## 2021-07-25 DIAGNOSIS — Z91040 Latex allergy status: Secondary | ICD-10-CM | POA: Insufficient documentation

## 2021-07-25 DIAGNOSIS — F17210 Nicotine dependence, cigarettes, uncomplicated: Secondary | ICD-10-CM | POA: Insufficient documentation

## 2021-07-25 DIAGNOSIS — S0003XA Contusion of scalp, initial encounter: Secondary | ICD-10-CM | POA: Insufficient documentation

## 2021-07-25 DIAGNOSIS — I10 Essential (primary) hypertension: Secondary | ICD-10-CM | POA: Insufficient documentation

## 2021-07-25 DIAGNOSIS — W01198A Fall on same level from slipping, tripping and stumbling with subsequent striking against other object, initial encounter: Secondary | ICD-10-CM | POA: Insufficient documentation

## 2021-07-25 DIAGNOSIS — Z79899 Other long term (current) drug therapy: Secondary | ICD-10-CM | POA: Insufficient documentation

## 2021-07-25 DIAGNOSIS — R55 Syncope and collapse: Secondary | ICD-10-CM | POA: Insufficient documentation

## 2021-07-25 DIAGNOSIS — R42 Dizziness and giddiness: Secondary | ICD-10-CM | POA: Insufficient documentation

## 2021-07-25 DIAGNOSIS — S8392XA Sprain of unspecified site of left knee, initial encounter: Secondary | ICD-10-CM | POA: Insufficient documentation

## 2021-07-25 DIAGNOSIS — W19XXXA Unspecified fall, initial encounter: Secondary | ICD-10-CM

## 2021-07-25 LAB — COMPREHENSIVE METABOLIC PANEL
ALT: 15 U/L (ref 0–44)
AST: 22 U/L (ref 15–41)
Albumin: 4 g/dL (ref 3.5–5.0)
Alkaline Phosphatase: 88 U/L (ref 38–126)
Anion gap: 14 (ref 5–15)
BUN: 13 mg/dL (ref 6–20)
CO2: 21 mmol/L — ABNORMAL LOW (ref 22–32)
Calcium: 9.2 mg/dL (ref 8.9–10.3)
Chloride: 96 mmol/L — ABNORMAL LOW (ref 98–111)
Creatinine, Ser: 0.62 mg/dL (ref 0.44–1.00)
GFR, Estimated: >60 mL/min (ref >60)
Glucose, Bld: 112 mg/dL — ABNORMAL HIGH (ref 70–99)
Potassium: 3.4 mmol/L — ABNORMAL LOW (ref 3.5–5.1)
Sodium: 131 mmol/L — ABNORMAL LOW (ref 135–145)
Total Bilirubin: 0.6 mg/dL (ref 0.3–1.2)
Total Protein: 8 g/dL (ref 6.5–8.1)

## 2021-07-25 LAB — RAPID URINE DRUG SCREEN, HOSP PERFORMED
Amphetamines: NOT DETECTED
Barbiturates: NOT DETECTED
Benzodiazepines: NOT DETECTED
Cocaine: NOT DETECTED
Opiates: NOT DETECTED
Tetrahydrocannabinol: NOT DETECTED

## 2021-07-25 LAB — CBC
HCT: 41.8 % (ref 36.0–46.0)
Hemoglobin: 14.1 g/dL (ref 12.0–15.0)
MCH: 32.7 pg (ref 26.0–34.0)
MCHC: 33.7 g/dL (ref 30.0–36.0)
MCV: 97 fL (ref 80.0–100.0)
Platelets: 285 10*3/uL (ref 150–400)
RBC: 4.31 MIL/uL (ref 3.87–5.11)
RDW: 12.6 % (ref 11.5–15.5)
WBC: 10 10*3/uL (ref 4.0–10.5)
nRBC: 0 % (ref 0.0–0.2)

## 2021-07-25 LAB — I-STAT BETA HCG BLOOD, ED (MC, WL, AP ONLY): I-stat hCG, quantitative: <5 m[IU]/mL (ref <5)

## 2021-07-25 LAB — CK: Total CK: 46 U/L (ref 38–234)

## 2021-07-25 MED ORDER — NAPROXEN 500 MG PO TABS: 500.0000 mg | ORAL_TABLET | Freq: Two times a day (BID) | ORAL | 0 refills | Status: AC

## 2021-07-25 NOTE — ED Provider Notes (Signed)
Emergency Medicine Provider Triage Evaluation Note  Samantha Howard , a 40 y.o. female  was evaluated in triage.  Pt complains of LOC. Pt passed out twice yesterday, she is unsure why. States that when she synopsized last time she was out for over 45 minutes. Did hit her head. No pain besides her knee which she hit on the way down. No seizure disorder. No prodomre, no chest pain, SOB.   Review of Systems  Positive: syncope Negative:   Physical Exam  BP (!) 158/116 (BP Location: Left Arm)   Pulse (!) 109   Temp 98.2 F (36.8 C) (Oral)   Resp 18   LMP 12/27/2015   SpO2 96%  Gen:   Awake, no distress   Resp:  Normal effort  MSK:   Moves extremities without difficulty  Other:  No facial droop, normal speech. CN 2-12 grossly intact.   Medical Decision Making  Medically screening exam initiated at 6:35 PM.  Appropriate orders placed.  Samantha Howard was informed that the remainder of the evaluation will be completed by another provider, this initial triage assessment does not replace that evaluation, and the importance of remaining in the ED until their evaluation is complete.     Alfredia Client, PA-C 07/25/21 1838    Lorelle Gibbs, DO 07/25/21 2259

## 2021-07-25 NOTE — ED Provider Notes (Signed)
[facility] DEPT Provider Note   CSN: [id number] Arrival date & time: 07/25/21  1820     History Chief Complaint  Patient presents with   Knee Pain    Niyonna Mcfate is a 40 y.o. female.  40 year old female presents with complaint of left knee pain.  Patient states that she was standing yesterday when she began to feel dizzy and lightheaded and passed out hitting her head on the floor.  Patient states that she had pain in her left knee when she woke up which persist today.  She reports a knot on her head, is not anticoagulated, does not have significant headache, nausea, visual disturbance.  Pain to the left knee is located anterior knee, worse with ambulating.  No other complaints or concerns today.      Past Medical History:  Diagnosis Date   Alcohol use    Anemia    Bipolar 1 disorder ([place])    Blood in stool 2017   Depression    Hypertension    Seizures ([place])    Pt reports none in the last 10 years    Patient Active Problem List   Diagnosis Date Noted   HTN (hypertension), benign 02/06/2017   Family history of breast cancer in first degree relative 02/06/2017   Bipolar 2 disorder, major depressive episode ([place]) 01/28/2016   Alcohol dependence with alcohol-induced mood disorder ([place])    Major depressive disorder, recurrent, severe without psychotic features ([place]) 01/24/2016   Alcohol abuse 01/19/2016   MDD (major depressive disorder), recurrent severe, without psychosis ([place]) 01/19/2016   Bipolar 1 disorder, mixed, severe ([place]) 01/19/2016   Postoperative state 12/27/2015    Past Surgical History:  Procedure Laterality Date   ABDOMINAL HYSTERECTOMY  2016   BILATERAL SALPINGECTOMY Bilateral 12/27/2015   Procedure: BILATERAL SALPINGECTOMY;  Surgeon: Bobbye Charleston, MD;  Location: [facility];  Service: Gynecology;  Laterality: Bilateral;   CYSTOSCOPY N/A 12/27/2015   Procedure: CYSTOSCOPY;  Surgeon: Bobbye Charleston, MD;  Location: [facility];  Service: Gynecology;  Laterality: N/A;   NO PAST SURGERIES     ROBOTIC ASSISTED TOTAL HYSTERECTOMY N/A 12/27/2015   Procedure: ROBOTIC ASSISTED TOTAL HYSTERECTOMY;  Surgeon: Bobbye Charleston, MD;  Location: [facility];  Service: Gynecology;  Laterality: N/A;     OB History     Gravida  0   Para      Term      Preterm      AB      Living         SAB      IAB      Ectopic      Multiple      Live Births              Family History  Problem Relation Age of Onset   Hypertension Mother    Cancer Mother 75       ovarian   Breast cancer Mother    Hypertension Maternal Grandmother    Cancer Maternal Grandmother 8       breast cancer    Social History   Tobacco Use   Smoking status: Every Day    Packs/day: 1.00    Types: Cigars, Cigarettes   Smokeless tobacco: Never  Vaping Use   Vaping Use: Never used  Substance Use Topics   Alcohol use: Yes    Alcohol/week: 6.0 standard drinks    Types: 6 Cans of beer per week    Comment: socially   Drug  use: No    Home Medications Prior to Admission medications   Medication Sig Start Date End Date Taking? Authorizing Provider  naproxen (NAPROSYN) 500 MG tablet Take 1 tablet (500 mg total) by mouth 2 (two) times daily. 07/25/21  Yes Tacy Learn, PA-C  amLODipine (NORVASC) 5 MG tablet Take 1 tablet (5 mg total) by mouth daily. Patient not taking: Reported on 02/27/2017 02/06/17   Nche, Charlene Brooke, NP  doxycycline (VIBRAMYCIN) 100 MG capsule Take 1 capsule (100 mg total) by mouth 2 (two) times daily. 03/27/19   Lacretia Leigh, MD  hydrochlorothiazide (MICROZIDE) 12.5 MG capsule Take 1 capsule (12.5 mg total) by mouth daily. Patient not taking: Reported on 06/14/2018 02/06/17   Nche, Charlene Brooke, NP  ibuprofen (ADVIL,MOTRIN) 200 MG tablet Take 800 mg by mouth every 6 (six) hours as needed for moderate pain.    [provider]    Allergies    Latex and Other  Review of Systems   Review of Systems   Constitutional:  Negative for fever.  Respiratory:  Negative for shortness of breath.   Cardiovascular:  Negative for chest pain.  Gastrointestinal:  Negative for abdominal pain.  Musculoskeletal:  Positive for arthralgias and gait problem. Negative for back pain, joint swelling, neck pain and neck stiffness.  Skin:  Negative for rash and wound.  Allergic/Immunologic: Negative for immunocompromised state.  Neurological:  Positive for syncope. Negative for dizziness, weakness, numbness and headaches.  Hematological:  Does not bruise/bleed easily.  Psychiatric/Behavioral:  Negative for confusion.   All other systems reviewed and are negative.  Physical Exam Updated Vital Signs BP (!) 146/97   Pulse (!) 104   Temp 98.2 F (36.8 C) (Oral)   Resp 17   LMP 12/27/2015   SpO2 98%   Physical Exam Vitals and nursing note reviewed.  Constitutional:      General: She is not in acute distress.    Appearance: She is well-developed. She is not diaphoretic.  HENT:     Head: Normocephalic and atraumatic.  Cardiovascular:     Pulses: Normal pulses.  Pulmonary:     Effort: Pulmonary effort is normal.  Musculoskeletal:        General: Tenderness present. No swelling or deformity.     Right lower leg: No edema.     Left lower leg: Tenderness present. No deformity or lacerations. No edema.       Legs:     Comments: Tenderness and posterior left knee with mild ecchymosis, no effusion, no swelling, no ligamentous laxity, able to flex to 90 degrees and extend fully.  Skin:    General: Skin is warm and dry.     Findings: No erythema or rash.  Neurological:     Mental Status: She is alert and oriented to person, place, and time.     Sensory: No sensory deficit.     Motor: No weakness.  Psychiatric:        Behavior: Behavior normal.    ED Results / Procedures / Treatments   Labs (all labs ordered are listed, but only abnormal results are displayed) Labs Reviewed  COMPREHENSIVE METABOLIC  PANEL - Abnormal; Notable for the following components:      Result Value   Sodium 131 (*)    Potassium 3.4 (*)    Chloride 96 (*)    CO2 21 (*)    Glucose, Bld 112 (*)    All other components within normal limits  CBC  RAPID URINE DRUG SCREEN,  HOSP PERFORMED  CK  I-STAT BETA HCG BLOOD, ED ([facility], [facility], [facility] ONLY)    EKG None Sinus tachycardia with rate of 104, no ischemic changes. Radiology CT Head Wo Contrast  Result Date: 07/25/2021 CLINICAL DATA:  Syncopal episode, right cephalgia EXAM: CT HEAD WITHOUT CONTRAST TECHNIQUE: Contiguous axial images were obtained from the base of the skull through the vertex without intravenous contrast. COMPARISON:  CT 03/01/2015 FINDINGS: Brain: No evidence of acute infarction, hemorrhage, hydrocephalus, extra-axial collection, visible mass lesion or mass effect. Vascular: No hyperdense vessel or unexpected calcification. Skull: Right parietal scalp swelling and thickening with small crescentic scalp hematoma up to 3 mm in maximal thickness. No subjacent calvarial fracture or acute osseous injury is identified. Sinuses/Orbits: Paranasal sinuses and mastoid air cells are predominantly clear. Included orbital structures are unremarkable. Other: None IMPRESSION: Right parietal scalp swelling and thin crescentic scalp hematoma. No calvarial fracture. No acute intracranial abnormality. Electronically Signed   By: Lovena Le M.D.   On: 07/25/2021 19:14   DG Knee Complete 4 Views Left  Result Date: 07/25/2021 CLINICAL DATA:  Post fall with knee pain since last night. EXAM: LEFT KNEE - COMPLETE 4+ VIEW COMPARISON:  None. FINDINGS: No evidence of fracture, dislocation, or joint effusion. No evidence of arthropathy or other focal bone abnormality. Soft tissues are unremarkable. IMPRESSION: Negative radiographs of the left knee. Electronically Signed   By: Keith Rake M.D.   On: 07/25/2021 19:10    Procedures Procedures   Medications Ordered in ED Medications -  No data to display  ED Course  I have reviewed the triage vital signs and the nursing notes.  Pertinent labs & imaging results that were available during my care of the patient were reviewed by me and considered in my medical decision making (see chart for details).  Clinical Course as of 07/25/21 2251  Wed Jul 25, 6972  [id number] 40 year old female with report of left knee pain, syncopal episode yesterday preceded by feeling dizzy and lightheaded. X-ray knee unremarkable, CT head with scalp hematoma otherwise unremarkable. Found to have anterior posterior left knee tenderness without laxity.  No significant injuries or exam findings otherwise. Labs reviewed from triage, no significant changes to CBC, CMP.  CK normal, hCG negative, UDS negative.  Patient is given crutches, request wrap to left knee, Ace wrap provided.  Given prescription for course of naproxen and referred to Ortho for follow-up if not improving. [LM]    Clinical Course User Index [LM] Roque Lias   MDM Rules/Calculators/A&P                           Final Clinical Impression(s) / ED Diagnoses Final diagnoses:  Fall, initial encounter  Hematoma of scalp, initial encounter  Sprain of left knee, unspecified ligament, initial encounter    Rx / DC Orders ED Discharge Orders          Ordered    naproxen (NAPROSYN) 500 MG tablet  2 times daily        07/25/21 2220             Tacy Learn, PA-C 07/25/21 2251    Regan Lemming, MD 07/25/21 2318

## 2021-07-25 NOTE — ED Triage Notes (Signed)
Pt complains of left knee pain since falling yesterday. She states she passed out while waiting in line. She reports hitting her head. She is not on blood thinners.

## 2022-12-14 IMAGING — CR DG KNEE COMPLETE 4+V*L*
4 series · 4 of 4 positions shown · non-contrast
Comparison: None.

CLINICAL DATA: Post fall with knee pain since last night.

EXAM:
LEFT KNEE - COMPLETE 4+ VIEW

[t knee ap left]
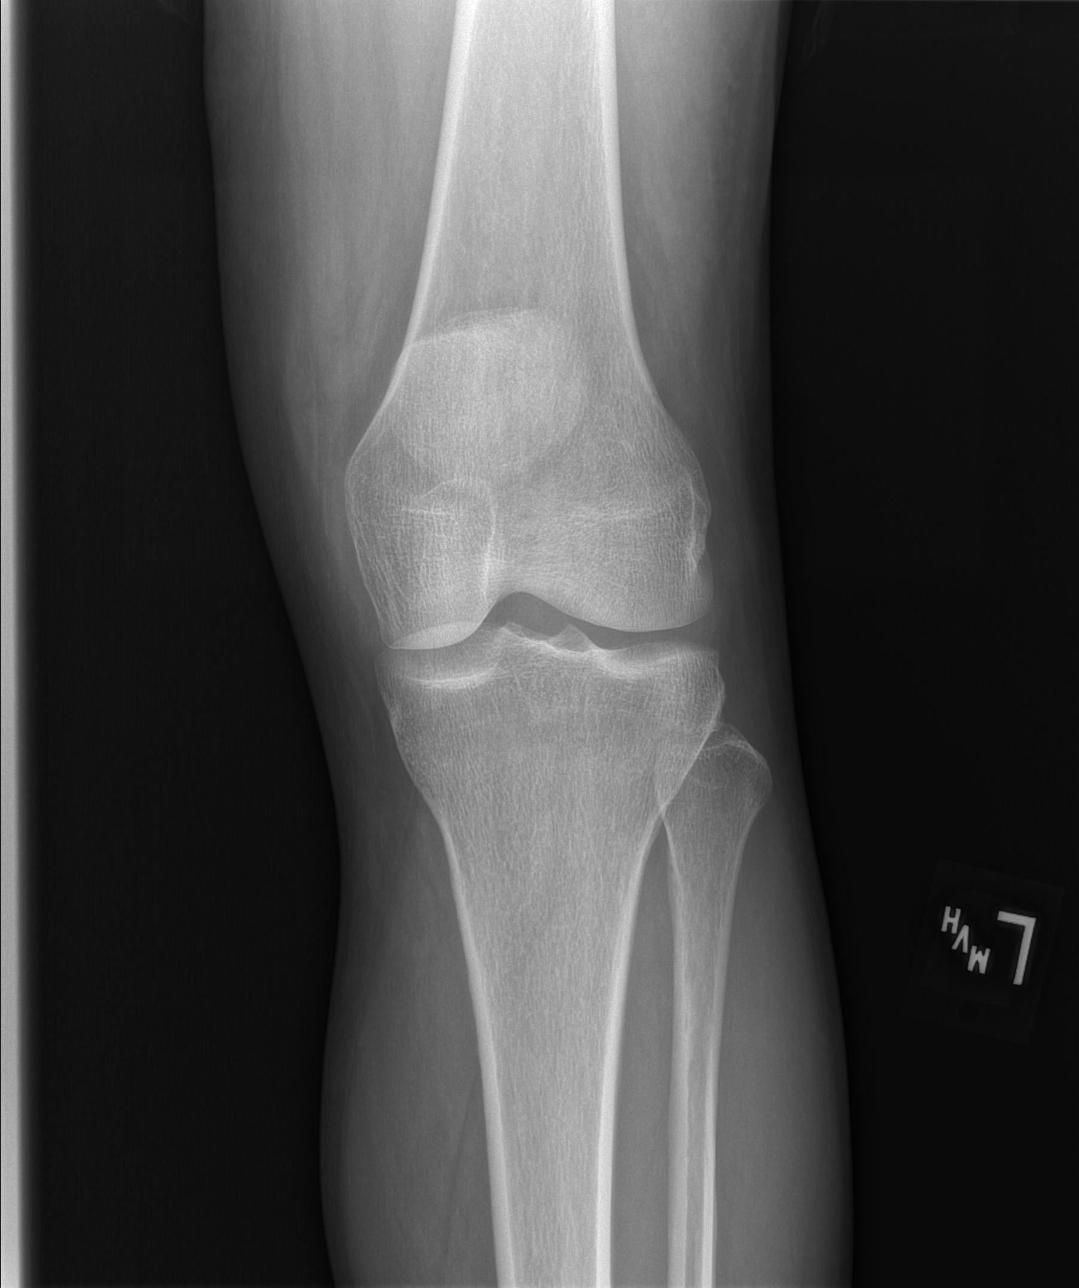

[t knee obl left (1 of 2)]
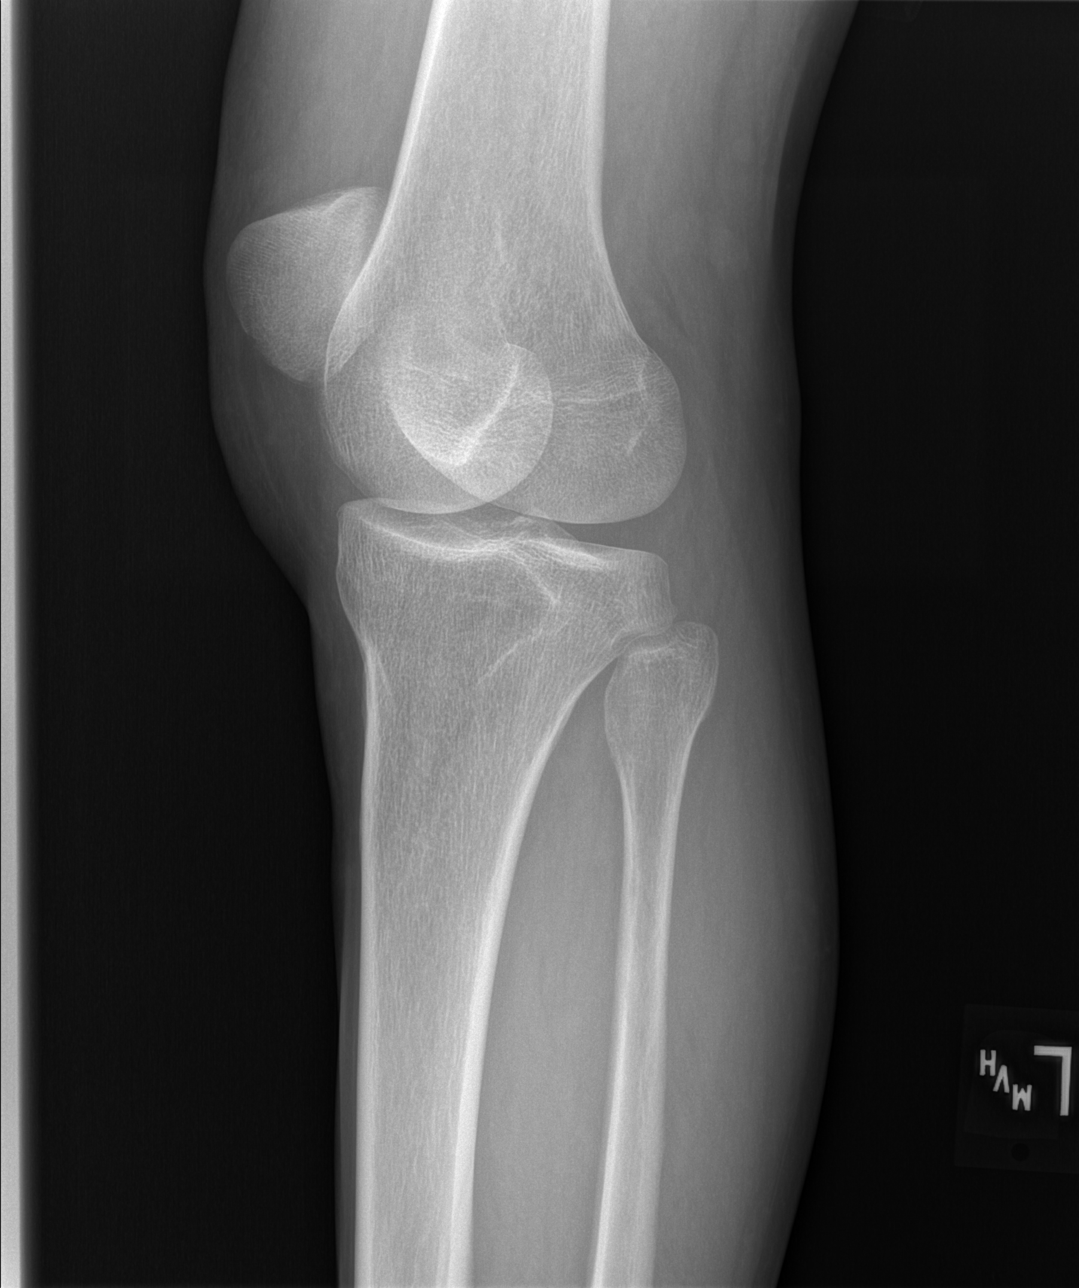

[t knee obl left (2 of 2)]
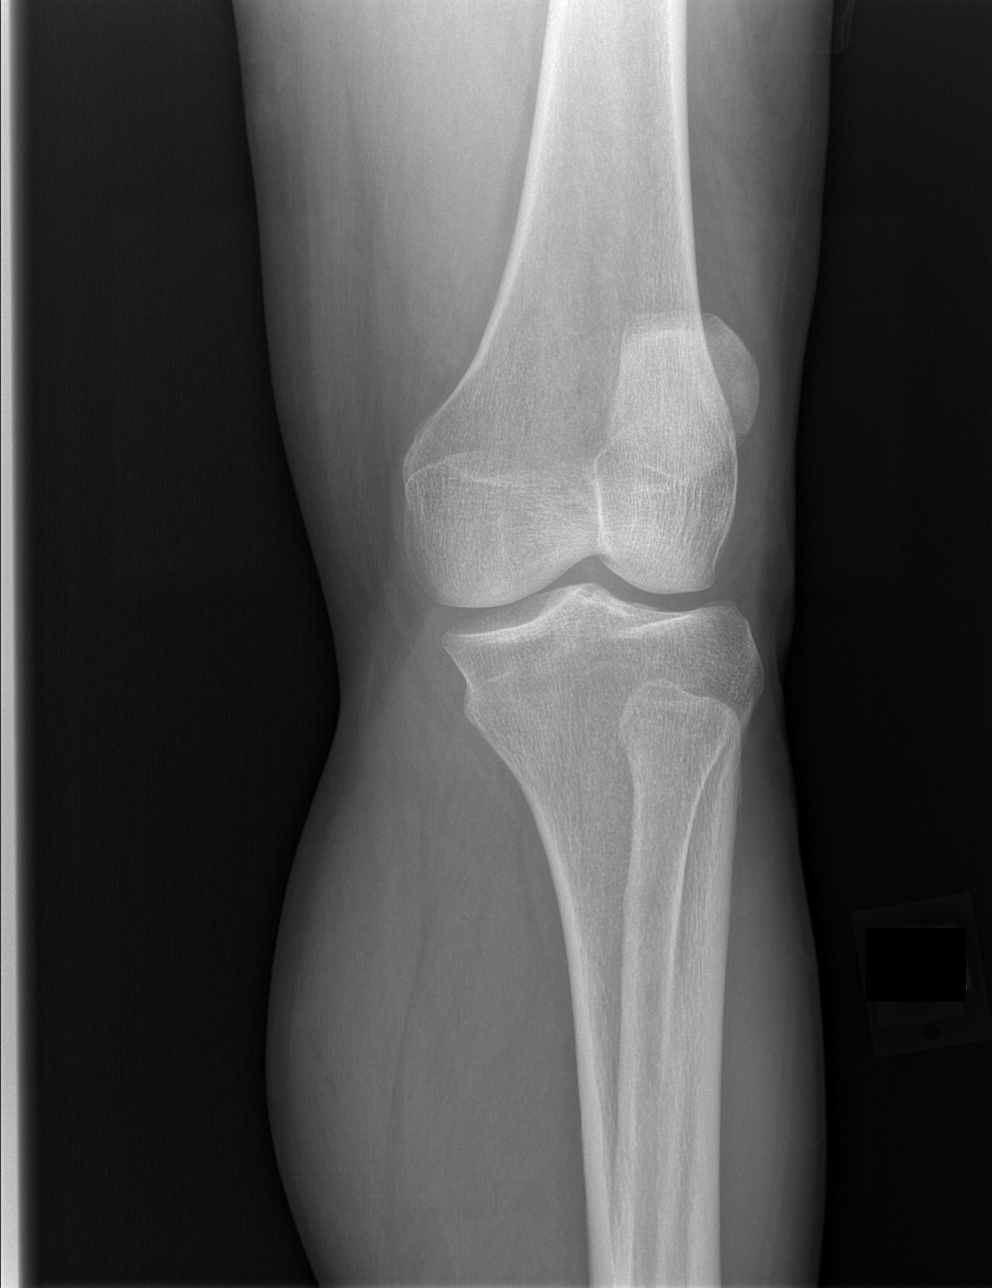

[t knee lat left]
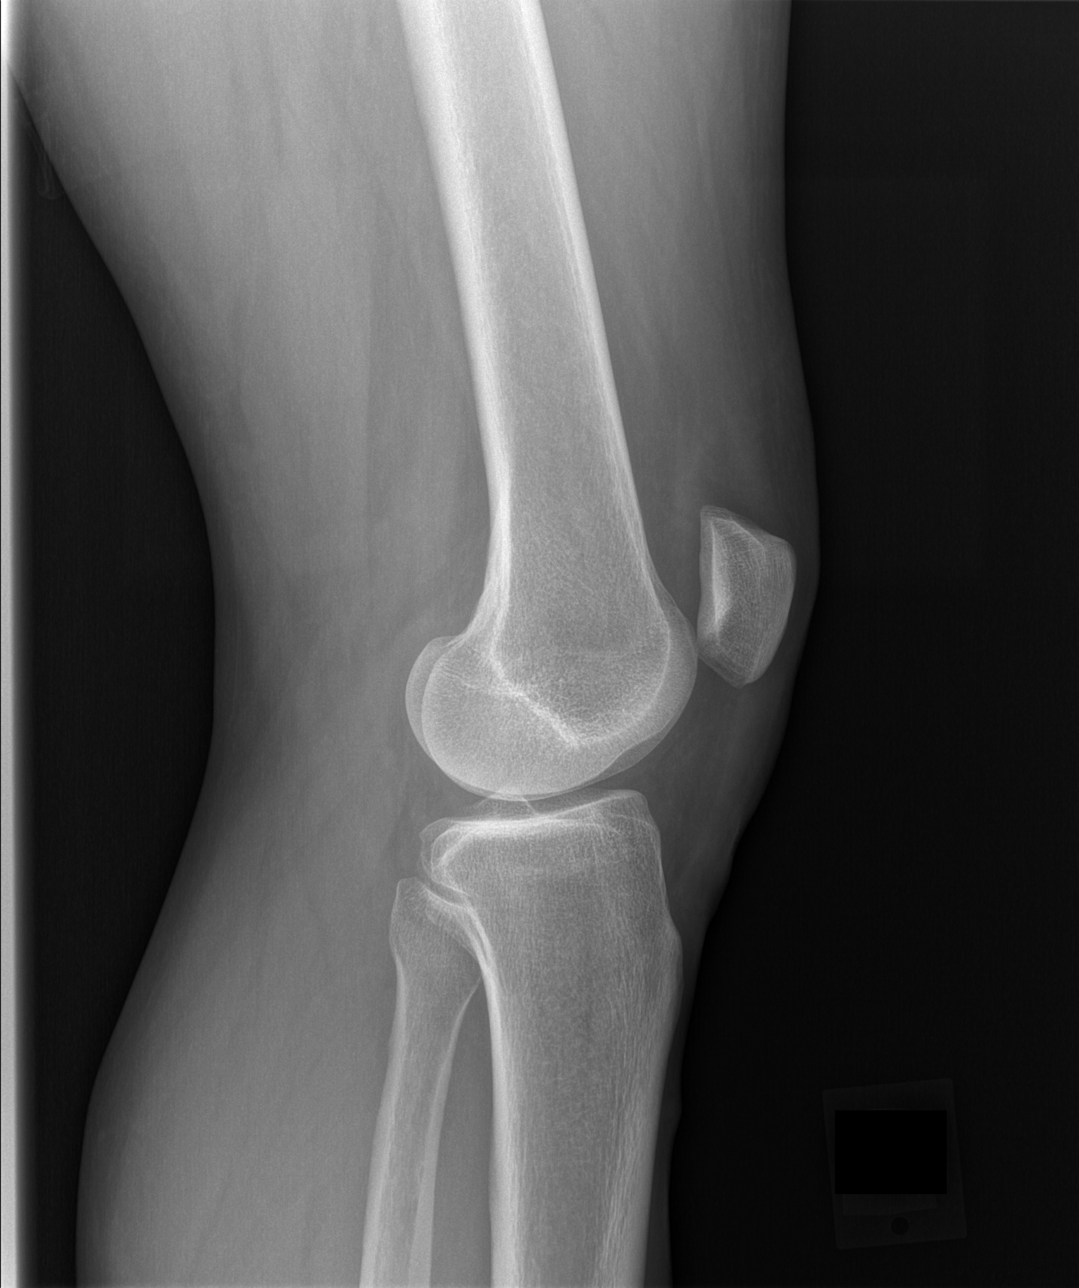

[4 of 4 positions shown; findings below may reference images not displayed]

FINDINGS: No evidence of fracture, dislocation, or joint effusion. No evidence
of arthropathy or other focal bone abnormality. Soft tissues are
unremarkable.
IMPRESSION: Negative radiographs of the left knee.
